# Patient Record
Sex: Female | Born: 1990 | Race: White | Hispanic: No | Marital: Single | Smoking: Current some day smoker
Health system: Southern US, Community
[De-identification: ages and names within clinical notes are randomized; demographics above are authoritative.]

## PROBLEM LIST (undated history)

## (undated) DIAGNOSIS — R569 Unspecified convulsions: Secondary | ICD-10-CM

## (undated) DIAGNOSIS — J45909 Unspecified asthma, uncomplicated: Secondary | ICD-10-CM

## (undated) DIAGNOSIS — F988 Other specified behavioral and emotional disorders with onset usually occurring in childhood and adolescence: Secondary | ICD-10-CM

## (undated) DIAGNOSIS — B009 Herpesviral infection, unspecified: Secondary | ICD-10-CM

## (undated) HISTORY — DX: Herpesviral infection, unspecified: B00.9

## (undated) HISTORY — DX: Unspecified asthma, uncomplicated: J45.909

## (undated) HISTORY — DX: Other specified behavioral and emotional disorders with onset usually occurring in childhood and adolescence: F98.8

---
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
## 2011-01-16 ENCOUNTER — Other Ambulatory Visit (HOSPITAL_COMMUNITY)
Admission: RE | Admit: 2011-01-16 | Discharge: 2011-01-16 | Disposition: A | Discharge status: Home / Self Care | Payer: PRIVATE HEALTH INSURANCE | Source: Ambulatory Visit | Attending: Internal Medicine | Admitting: Internal Medicine

## 2011-01-16 ENCOUNTER — Encounter: Payer: Self-pay | Admitting: Internal Medicine

## 2011-01-16 ENCOUNTER — Ambulatory Visit (INDEPENDENT_AMBULATORY_CARE_PROVIDER_SITE_OTHER): Payer: PRIVATE HEALTH INSURANCE | Admitting: Internal Medicine

## 2011-01-16 VITALS — BP 116/82 | HR 88 | Temp 98.0°F | Ht 67.0 in | Wt 165.0 lb

## 2011-01-16 DIAGNOSIS — Z01419 Encounter for gynecological examination (general) (routine) without abnormal findings: Secondary | ICD-10-CM | POA: Insufficient documentation

## 2011-01-16 DIAGNOSIS — Z0000 Encounter for general adult medical examination without abnormal findings: Secondary | ICD-10-CM

## 2011-01-16 DIAGNOSIS — F988 Other specified behavioral and emotional disorders with onset usually occurring in childhood and adolescence: Secondary | ICD-10-CM

## 2011-01-16 DIAGNOSIS — Z124 Encounter for screening for malignant neoplasm of cervix: Secondary | ICD-10-CM

## 2011-01-16 LAB — POCT URINALYSIS DIPSTICK
Bilirubin, UA: NEGATIVE
Blood, UA: NEGATIVE
Ketones, UA: NEGATIVE
Spec Grav, UA: 1
pH, UA: 5

## 2011-01-16 NOTE — Progress Notes (Signed)
  Subjective:    Patient ID: Kara Riddle, female    DOB: 10/20/1990, 20 y.o.   MRN: [id number]  HPI 20 year old white female with history of attention deficit disorder, rising sophomore at [employer] hoping to study nursing, in today for health maintenance exam. Patient says she made good grades this past year she feels due to Adderall helping her concentrate. She is currently on Adderall XR 30 mg daily. No complaints with menstrual periods. Has had the Gardasil vaccine (3). Other immunizations are up-to-date. No other complaints or problems. 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  Patient does not smoke. Social alcohol consumption. Parents in good health. Menarche was at age 57. Denies being sexually active.    Review of Systems  Constitutional: Negative.   HENT: Negative.   Eyes: Negative.   Respiratory: Negative.   Cardiovascular: Negative.   Gastrointestinal: Negative.   Genitourinary: Negative.   Musculoskeletal: Negative.   Neurological: Negative.   Hematological: Negative.   Psychiatric/Behavioral: Negative.        Objective:   Physical Exam  Constitutional: She is oriented to person, place, and time. She appears well-developed and well-nourished. No distress.  HENT:  Head: Normocephalic and atraumatic.  Right Ear: External ear normal.  Left Ear: External ear normal.  Mouth/Throat: No oropharyngeal exudate.  Eyes: Conjunctivae and EOM are normal. Pupils are equal, round, and reactive to light. No scleral icterus.  Neck: Neck supple. No JVD present. No thyromegaly present.  Cardiovascular: Normal rate, regular rhythm, normal heart sounds and intact distal pulses.   No murmur heard. Pulmonary/Chest: Effort normal and breath sounds normal. No respiratory distress. She has no rales.       Breasts normal female. Some fibrocystic changes bilaterally  Abdominal: Soft. Bowel sounds are normal. She exhibits no mass. There is no rebound.  Genitourinary:  Vagina normal and uterus normal.       Pap taken  Lymphadenopathy:    She has no cervical adenopathy.  Neurological: She is alert and oriented to person, place, and time. She has normal reflexes. No cranial nerve deficit.  Skin: Skin is warm and dry.  Psychiatric: She has a normal mood and affect. Her behavior is normal. Judgment and thought content normal.          Assessment & Plan:  Normal health maintenance exam. Cholesterol normal at 150. CBC with differential is normal.  History of attention deficit disorder maintained on Adderall XR 30 mg daily. 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 Have given her 3 prescriptions for Adderall XR 30 mg daily dated 01/16/2011, 02/16/2011, 03/18/2011 for #30 tablets each. Return one year/when necessary

## 2011-01-18 ENCOUNTER — Other Ambulatory Visit: Payer: Self-pay | Admitting: Internal Medicine

## 2011-04-03 ENCOUNTER — Other Ambulatory Visit: Payer: Self-pay

## 2011-04-03 ENCOUNTER — Telehealth: Payer: Self-pay | Admitting: Internal Medicine

## 2011-04-03 MED ORDER — AMPHETAMINE-DEXTROAMPHET ER 30 MG PO CP24: 30.0000 mg | ORAL_CAPSULE | ORAL | Status: DC

## 2011-04-03 NOTE — Telephone Encounter (Signed)
RX Adderall XR 30mg  daily #30 written

## 2011-05-04 ENCOUNTER — Telehealth: Payer: Self-pay | Admitting: Internal Medicine

## 2011-05-04 NOTE — Telephone Encounter (Signed)
Three written Rx for Adderall XR 30mg  #30 tabs each to take one daily. Pt needs flu vaccine when home or go to drug store.

## 2011-05-05 ENCOUNTER — Other Ambulatory Visit: Payer: Self-pay

## 2011-05-05 MED ORDER — AMPHETAMINE-DEXTROAMPHET ER 30 MG PO CP24: 30.0000 mg | ORAL_CAPSULE | ORAL | Status: DC

## 2011-05-31 ENCOUNTER — Telehealth: Payer: Self-pay | Admitting: Internal Medicine

## 2011-05-31 NOTE — Telephone Encounter (Signed)
See if [facility] will see her before she goes back to school.

## 2011-06-01 NOTE — Telephone Encounter (Signed)
Appt scheduled for patient to see nurse practitioner, Arlana Lindau on January 8th at 3:00pm.  Pt is to arrive at 2:45 with photo ID and Ins card.  Pt made aware of appt.

## 2011-07-13 ENCOUNTER — Other Ambulatory Visit: Payer: Self-pay | Admitting: Internal Medicine

## 2011-07-13 NOTE — Telephone Encounter (Signed)
This refill appears to be too early because she was given 3 rx for Adderall on 05/05/2011. Should not be out yet.

## 2011-07-13 NOTE — Telephone Encounter (Signed)
Spoke with mom, Amy and advised too early to fill. Will call back at end of February

## 2011-07-13 NOTE — Telephone Encounter (Signed)
Please call mother to discuss.

## 2011-07-27 ENCOUNTER — Telehealth: Payer: Self-pay | Admitting: Internal Medicine

## 2011-07-27 MED ORDER — AMPHETAMINE-DEXTROAMPHET ER 30 MG PO CP24: 30.0000 mg | ORAL_CAPSULE | ORAL | Status: DC

## 2011-07-27 NOTE — Telephone Encounter (Signed)
Prescription written and mother picked up.

## 2011-08-31 ENCOUNTER — Telehealth: Payer: Self-pay | Admitting: Internal Medicine

## 2011-08-31 ENCOUNTER — Other Ambulatory Visit: Payer: Self-pay

## 2011-08-31 MED ORDER — AMPHETAMINE-DEXTROAMPHET ER 30 MG PO CP24: 30.0000 mg | ORAL_CAPSULE | ORAL | Status: DC

## 2011-08-31 NOTE — Telephone Encounter (Signed)
rx written for Adderall XR 30 mg daily/0 refills.

## 2011-09-22 ENCOUNTER — Telehealth: Payer: Self-pay | Admitting: Internal Medicine

## 2011-09-24 NOTE — Telephone Encounter (Signed)
Was given refill 3/28/ 13. Cannot refill until 10/01/11. Why is mother calling early???

## 2011-09-26 ENCOUNTER — Telehealth: Payer: Self-pay | Admitting: Internal Medicine

## 2011-09-26 NOTE — Telephone Encounter (Signed)
New Rx for Adderall XR 30 mg daily  #30 with no refill dated April 28th. Due for PE on or after August 15th. Cannot continue to refill Adderall if Appointment for annual PE is not booked. Mother needs to be reminded that we can only refill meds q 30 days and this cannot be too early regardless of circumstances.

## 2011-10-31 ENCOUNTER — Other Ambulatory Visit: Payer: Self-pay

## 2011-10-31 MED ORDER — AMPHETAMINE-DEXTROAMPHET ER 30 MG PO CP24: 30.0000 mg | ORAL_CAPSULE | ORAL | Status: DC

## 2011-11-20 ENCOUNTER — Other Ambulatory Visit: Payer: Self-pay

## 2011-11-20 DIAGNOSIS — B009 Herpesviral infection, unspecified: Secondary | ICD-10-CM

## 2011-11-20 MED ORDER — VALACYCLOVIR HCL 500 MG PO TABS: 500.0000 mg | ORAL_TABLET | Freq: Two times a day (BID) | ORAL | Status: DC

## 2011-11-27 ENCOUNTER — Telehealth: Payer: Self-pay | Admitting: Internal Medicine

## 2011-11-27 NOTE — Telephone Encounter (Signed)
Rx for Adderall XR 30mg  # 30 Written for December 01, 2011. Must keep appt in August for PE

## 2011-12-27 ENCOUNTER — Other Ambulatory Visit: Payer: Self-pay

## 2011-12-27 MED ORDER — AMPHETAMINE-DEXTROAMPHET ER 30 MG PO CP24: 30.0000 mg | ORAL_CAPSULE | ORAL | Status: DC

## 2012-01-18 ENCOUNTER — Other Ambulatory Visit: Payer: PRIVATE HEALTH INSURANCE | Admitting: Internal Medicine

## 2012-01-18 DIAGNOSIS — Z0000 Encounter for general adult medical examination without abnormal findings: Secondary | ICD-10-CM

## 2012-01-18 LAB — CBC WITH DIFFERENTIAL/PLATELET
Basophils Absolute: 0 10*3/uL (ref 0.0–0.1)
Eosinophils Relative: 8 % — ABNORMAL HIGH (ref 0–5)
HCT: 39.1 % (ref 36.0–46.0)
Hemoglobin: 13.2 g/dL (ref 12.0–15.0)
Lymphs Abs: 2.5 10*3/uL (ref 0.7–4.0)
MCH: 29.8 pg (ref 26.0–34.0)
MCHC: 33.8 g/dL (ref 30.0–36.0)
MCV: 88.3 fL (ref 78.0–100.0)
Monocytes Relative: 10 % (ref 3–12)
RBC: 4.43 MIL/uL (ref 3.87–5.11)

## 2012-01-18 LAB — CHOLESTEROL, TOTAL: Cholesterol: 148 mg/dL (ref 0–200)

## 2012-01-19 ENCOUNTER — Encounter: Payer: Self-pay | Admitting: Internal Medicine

## 2012-01-19 ENCOUNTER — Ambulatory Visit (INDEPENDENT_AMBULATORY_CARE_PROVIDER_SITE_OTHER): Payer: PRIVATE HEALTH INSURANCE | Admitting: Internal Medicine

## 2012-01-19 VITALS — BP 130/80 | HR 92 | Temp 98.0°F | Ht 66.25 in | Wt 164.5 lb

## 2012-01-19 DIAGNOSIS — Z0000 Encounter for general adult medical examination without abnormal findings: Secondary | ICD-10-CM

## 2012-01-19 DIAGNOSIS — F988 Other specified behavioral and emotional disorders with onset usually occurring in childhood and adolescence: Secondary | ICD-10-CM

## 2012-01-19 LAB — POCT URINALYSIS DIPSTICK
Spec Grav, UA: >=1.03
Urobilinogen, UA: NEGATIVE

## 2012-01-20 ENCOUNTER — Encounter: Payer: Self-pay | Admitting: Internal Medicine

## 2012-01-20 NOTE — Progress Notes (Signed)
  Subjective:    Patient ID: Kara Riddle, female    DOB: Jan 25, 1991, 21 y.o.   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 History of Attention Deficit Disorder maintained on generic Adderall XR 30 mg daily during school term. Going back to college today. Plan is to major in nursing. Has had Gardasil vaccine. Have sent request for her immunization records from [facility].  Patient does not smoke. Social alcohol consumption. Menarche at age 89. Has had GYN exam in February. Recently placed on Junel oral contraceptives. Has had some issues with breakthrough bleeding on other OCPs according to patient today.  Patient was born by C-section delivery and weighed 8.1 ounces at birth. No complications with delivery or pregnancy. Had normal childhood developmental milestones.  Parents healthy. No full siblings.  Never been hospitalized. No fractures. No operations.      Review of Systems  Constitutional: Negative.   All other systems reviewed and are negative.       Objective:   Physical Exam  Vitals reviewed. Constitutional: She is oriented to person, place, and time. She appears well-developed and well-nourished. No distress.  HENT:  Head: Normocephalic and atraumatic.  Right Ear: External ear normal.  Left Ear: External ear normal.  Mouth/Throat: Oropharynx is clear and moist. No oropharyngeal exudate.  Eyes: Conjunctivae and EOM are normal. Pupils are equal, round, and reactive to light. Right eye exhibits no discharge. Left eye exhibits no discharge. No scleral icterus.  Neck: Neck supple. No thyromegaly present.  Cardiovascular: Normal rate, regular rhythm, normal heart sounds and intact distal pulses.   No murmur heard. Pulmonary/Chest: Effort normal and breath sounds normal. No respiratory distress. She has no wheezes. She has no rales. She exhibits no tenderness.       Breasts normal female  without masses  Abdominal: Soft. Bowel sounds are normal. She exhibits no distension and no mass. There is no tenderness. There is no rebound and no guarding.  Genitourinary:       Deferred to GYN  Musculoskeletal: Normal range of motion. She exhibits no tenderness.  Lymphadenopathy:    She has no cervical adenopathy.  Neurological: She is alert and oriented to person, place, and time. She has normal reflexes. No cranial nerve deficit. Coordination normal.  Skin: Skin is warm and dry. No rash noted. She is not diaphoretic.  Psychiatric: She has a normal mood and affect. Her behavior is normal. Judgment and thought content normal.          Assessment & Plan:  Attention deficit disorder  Normal health maintenance exam  Plan: 3 written prescriptions for Adderall XR 30 mg generic dated August 16, September 16, October 16 for #30 tablets each. Return one year or as needed. See lab results which are within normal limits.

## 2012-01-20 NOTE — Patient Instructions (Addendum)
Continue Adderall XR 30 mg daily while in school and return in one year or as needed

## 2012-04-10 DIAGNOSIS — F988 Other specified behavioral and emotional disorders with onset usually occurring in childhood and adolescence: Secondary | ICD-10-CM

## 2012-04-11 ENCOUNTER — Telehealth: Payer: Self-pay | Admitting: Internal Medicine

## 2012-04-11 MED ORDER — AMPHETAMINE-DEXTROAMPHET ER 30 MG PO CP24: 30.0000 mg | ORAL_CAPSULE | ORAL | Status: DC

## 2012-04-11 NOTE — Telephone Encounter (Signed)
3 separate Rx written for Adderall and signed per Dr. Lenord Fellers

## 2012-07-03 ENCOUNTER — Telehealth: Payer: Self-pay | Admitting: Internal Medicine

## 2012-07-03 DIAGNOSIS — F988 Other specified behavioral and emotional disorders with onset usually occurring in childhood and adolescence: Secondary | ICD-10-CM

## 2012-07-03 MED ORDER — AMPHETAMINE-DEXTROAMPHET ER 30 MG PO CP24: 30.0000 mg | ORAL_CAPSULE | ORAL | Status: DC

## 2012-07-03 NOTE — Telephone Encounter (Signed)
rx for Adderall xr 30 mg printed for 3 months/separate rx

## 2012-08-20 ENCOUNTER — Telehealth: Payer: Self-pay | Admitting: Internal Medicine

## 2012-08-20 DIAGNOSIS — J069 Acute upper respiratory infection, unspecified: Secondary | ICD-10-CM

## 2012-08-20 NOTE — Telephone Encounter (Signed)
Call in Tessalon perles (#60) 2 po tid prn cough with no refill to [facility] [phone]

## 2012-08-20 NOTE — Telephone Encounter (Signed)
Spoke w/K[facility] @ [facility] [place]) [phone]; Rx for Tessalon perles 100 mg #60; 2 po tid prn cough w/0 refill.  Mother (Amy) notified of the Rx.  Bonita Quin advised that this is completed as well.

## 2012-09-26 ENCOUNTER — Other Ambulatory Visit: Payer: Self-pay

## 2012-09-26 MED ORDER — AMPHETAMINE-DEXTROAMPHET ER 30 MG PO CP24: 30.0000 mg | ORAL_CAPSULE | ORAL | Status: DC

## 2012-10-29 ENCOUNTER — Other Ambulatory Visit: Payer: Self-pay

## 2012-10-29 MED ORDER — AMPHETAMINE-DEXTROAMPHET ER 30 MG PO CP24: 30.0000 mg | ORAL_CAPSULE | ORAL | Status: DC

## 2012-12-04 ENCOUNTER — Other Ambulatory Visit: Payer: Self-pay

## 2012-12-04 MED ORDER — AMPHETAMINE-DEXTROAMPHET ER 30 MG PO CP24: 30.0000 mg | ORAL_CAPSULE | ORAL | Status: DC

## 2012-12-20 ENCOUNTER — Other Ambulatory Visit: Payer: Self-pay

## 2012-12-20 ENCOUNTER — Ambulatory Visit: Payer: PRIVATE HEALTH INSURANCE | Admitting: Internal Medicine

## 2012-12-20 ENCOUNTER — Telehealth: Payer: Self-pay | Admitting: Internal Medicine

## 2012-12-20 MED ORDER — NORETHIN ACE-ETH ESTRAD-FE 1.5-30 MG-MCG PO TABS: 1.0000 | ORAL_TABLET | Freq: Every day | ORAL | Status: DC

## 2012-12-20 NOTE — Telephone Encounter (Signed)
Patient scheduled for upcoming CPE.

## 2013-01-02 ENCOUNTER — Other Ambulatory Visit: Payer: Self-pay

## 2013-01-02 MED ORDER — AMPHETAMINE-DEXTROAMPHET ER 30 MG PO CP24: 30.0000 mg | ORAL_CAPSULE | ORAL | Status: DC

## 2013-01-17 ENCOUNTER — Other Ambulatory Visit: Payer: Self-pay | Admitting: Internal Medicine

## 2013-01-20 ENCOUNTER — Encounter: Payer: PRIVATE HEALTH INSURANCE | Admitting: Internal Medicine

## 2013-01-31 ENCOUNTER — Encounter: Payer: Self-pay | Admitting: Internal Medicine

## 2013-01-31 ENCOUNTER — Ambulatory Visit (INDEPENDENT_AMBULATORY_CARE_PROVIDER_SITE_OTHER): Payer: PRIVATE HEALTH INSURANCE | Admitting: Internal Medicine

## 2013-01-31 ENCOUNTER — Other Ambulatory Visit: Payer: Self-pay

## 2013-01-31 VITALS — BP 120/84 | HR 88 | Ht 67.0 in | Wt 172.0 lb

## 2013-01-31 DIAGNOSIS — N921 Excessive and frequent menstruation with irregular cycle: Secondary | ICD-10-CM

## 2013-01-31 DIAGNOSIS — F988 Other specified behavioral and emotional disorders with onset usually occurring in childhood and adolescence: Secondary | ICD-10-CM

## 2013-01-31 DIAGNOSIS — Z0000 Encounter for general adult medical examination without abnormal findings: Secondary | ICD-10-CM

## 2013-01-31 LAB — POCT URINALYSIS DIPSTICK
Bilirubin, UA: NEGATIVE
Glucose, UA: NEGATIVE
Ketones, UA: NEGATIVE
Leukocytes, UA: NEGATIVE
Protein, UA: NEGATIVE

## 2013-01-31 MED ORDER — AMPHETAMINE-DEXTROAMPHET ER 30 MG PO CP24: 30.0000 mg | ORAL_CAPSULE | ORAL | Status: DC

## 2013-02-01 ENCOUNTER — Encounter: Payer: Self-pay | Admitting: Internal Medicine

## 2013-02-01 DIAGNOSIS — N921 Excessive and frequent menstruation with irregular cycle: Secondary | ICD-10-CM | POA: Insufficient documentation

## 2013-02-01 NOTE — Patient Instructions (Addendum)
Take Adderall 5 mg at 3 PM particularly on Tuesdays and Thursdays when he have long days and class. Continue Adderall XR 30 mg daily. Keep appointment with GYN physician we made for you in September regarding breakthrough bleeding. Return in 6 months.

## 2013-02-01 NOTE — Progress Notes (Signed)
Subjective:    Patient ID: Kara Riddle, female    DOB: [birth year], 22 y.o.   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 Patient is on oral contraceptives per [facility] and is still having issues with breakthrough bleeding. She is on low dose estrogen oral contraceptive. History of attention deficit disorder. Currently is on Adderall XR 30 mg daily. This past week was her first week of class and she noticed that her mind was wondering early afternoon. On Tuesdays and Thursdays she has long hours and class. This is probably not good for her to consider such a schedule in the future cause of difficulty concentrating. She wants to try short acting Adderall as an adjuvant to the Adderall XR. We're going to try Adderall 5 mg taken around 3 PM on Tuesdays and Thursdays when she has long days and class. She may be having some tolerance to her Adderall currently. I do not want to continue her on Adderall once she finishes college.  Past medical history: Never been hospitalized. No fractures. No operations. No serious illnesses. Menarche at age 89.  Social history: Single. Does not smoke. Social alcohol consumption.  Has had Gardasil vaccine. Declines influenza immunization today. Tetanus immunization is up-to-date.  Family history: Parents healthy. No full siblings.  Reportedly last Pap was February 2013 at [facility] office. We do not have a copy of the Pap smear today.  Reportedly is on Junel oral contraceptives.    Review of Systems  Constitutional: Negative.   HENT: Negative.   Eyes: Negative.   Respiratory: Negative.   Cardiovascular: Negative.   Gastrointestinal: Negative.   Endocrine: Negative.   Genitourinary:       Breakthrough bleeding on oral contraceptives  Allergic/Immunologic: Negative.   Neurological: Negative.   Hematological: Negative.    Psychiatric/Behavioral: Negative.        Objective:   Physical Exam  Vitals reviewed. Constitutional: She is oriented to person, place, and time. She appears well-developed and well-nourished. No distress.  Overweight.  HENT:  Head: Normocephalic and atraumatic.  Right Ear: External ear normal.  Left Ear: External ear normal.  Mouth/Throat: Oropharynx is clear and moist.  Eyes: Conjunctivae and EOM are normal. Pupils are equal, round, and reactive to light. Left eye exhibits no discharge. No scleral icterus.  Neck: Neck supple. No JVD present. No thyromegaly present.  Cardiovascular: Normal rate, regular rhythm, normal heart sounds and intact distal pulses.   No murmur heard. Pulmonary/Chest: Effort normal and breath sounds normal. No respiratory distress. She has no wheezes. She has no rales. She exhibits no tenderness.  Breasts normal female  Abdominal: Soft. Bowel sounds are normal. She exhibits no distension and no mass. There is no tenderness. There is no rebound and no guarding.  Genitourinary:  Deferred to GYN. They indicate her last Pap smear was in 2013.  Musculoskeletal: Normal range of motion. She exhibits no edema.  Lymphadenopathy:    She has no cervical adenopathy.  Neurological: She is alert and oriented to person, place, and time. She has normal reflexes. She displays normal reflexes. No cranial nerve deficit. Coordination normal.  Skin: Skin is warm and dry. No rash noted. She is not diaphoretic.  Psychiatric: She has a normal mood and affect. Her behavior is normal. Judgment and thought content normal.          Assessment & Plan:  Obesity  Attention deficit disorder without hyperactivity  Breakthrough bleeding on oral contraceptives  Plan: Appointment with GYN physician in September. Try Adderall 5 mg tablets at 3 PM particularly on Tuesdays and Thursdays when she has long days and class. Continue Adderall XR 30 mg daily. Was given a three-month  prescriptions for Adderall XR 30 mg a #60 Adderall 5 mg tablets. Return in 6 months for followup of attention deficit disorder since we are changing her medication regimen. She declines influenza immunization.

## 2013-02-13 ENCOUNTER — Other Ambulatory Visit: Payer: Self-pay | Admitting: Internal Medicine

## 2013-02-14 NOTE — Telephone Encounter (Signed)
Patient has appt with GYN in September. We will not refill after this.

## 2013-04-17 ENCOUNTER — Telehealth: Payer: Self-pay | Admitting: Internal Medicine

## 2013-04-17 NOTE — Telephone Encounter (Signed)
Valtrex 500 mg take 1 tablet twice daily.  #10 with 1 refill.  [facility] [place] [place] [facility][phone].  Left message on prescriber line.

## 2013-05-05 ENCOUNTER — Other Ambulatory Visit: Payer: Self-pay | Admitting: *Deleted

## 2013-05-05 MED ORDER — AMPHETAMINE-DEXTROAMPHET ER 30 MG PO CP24: 30.0000 mg | ORAL_CAPSULE | ORAL | Status: DC

## 2013-06-17 ENCOUNTER — Ambulatory Visit: Payer: PRIVATE HEALTH INSURANCE | Admitting: Internal Medicine

## 2013-08-01 ENCOUNTER — Telehealth: Payer: Self-pay | Admitting: Internal Medicine

## 2013-08-01 ENCOUNTER — Other Ambulatory Visit: Payer: Self-pay

## 2013-08-01 MED ORDER — AMPHETAMINE-DEXTROAMPHET ER 30 MG PO CP24: 30.0000 mg | ORAL_CAPSULE | ORAL | Status: DC

## 2013-08-01 NOTE — Telephone Encounter (Signed)
Refill for 90 days. Spoke with Mother. No concerns.

## 2013-09-03 ENCOUNTER — Telehealth: Payer: Self-pay | Admitting: Internal Medicine

## 2013-09-03 NOTE — Telephone Encounter (Signed)
My note indicated we were refilling for 90 days but I am not sure [facility] [place] [facility] in [facility] indicate only 30  Given with no refill. Please check into this Thursday morning. You may need to call pharmacy and or mother.

## 2013-09-04 NOTE — Telephone Encounter (Signed)
Mom returned my call, and does indeed remember getting 3 Rx for Adderall. States she just needs to remember where she put them, and will contact Eurydice/

## 2013-09-04 NOTE — Telephone Encounter (Signed)
Patient given 3 separate Rx for Adderall on 08/01/2013. Trying to reach mom, as she signed for these. Left message at work.

## 2013-10-29 ENCOUNTER — Telehealth: Payer: Self-pay | Admitting: Internal Medicine

## 2013-10-29 NOTE — Telephone Encounter (Signed)
Refill for 90 days, however must book CPE soon after August 29th or no more refills.

## 2013-10-30 ENCOUNTER — Other Ambulatory Visit: Payer: Self-pay

## 2013-10-30 MED ORDER — AMPHETAMINE-DEXTROAMPHET ER 30 MG PO CP24: 30.0000 mg | ORAL_CAPSULE | ORAL | Status: DC

## 2013-11-06 ENCOUNTER — Ambulatory Visit (INDEPENDENT_AMBULATORY_CARE_PROVIDER_SITE_OTHER): Payer: PRIVATE HEALTH INSURANCE | Admitting: Internal Medicine

## 2013-11-06 ENCOUNTER — Encounter: Payer: Self-pay | Admitting: Internal Medicine

## 2013-11-06 VITALS — BP 125/80 | HR 84 | Wt 182.0 lb

## 2013-11-06 DIAGNOSIS — Z2839 Other underimmunization status: Secondary | ICD-10-CM

## 2013-11-06 DIAGNOSIS — Z9189 Other specified personal risk factors, not elsewhere classified: Secondary | ICD-10-CM

## 2013-11-06 DIAGNOSIS — F988 Other specified behavioral and emotional disorders with onset usually occurring in childhood and adolescence: Secondary | ICD-10-CM

## 2013-11-06 NOTE — Patient Instructions (Addendum)
Forms completed for nursing school entrance. Return next week for vaccines and lab tests

## 2013-11-06 NOTE — Progress Notes (Signed)
   Subjective:    Patient ID: Kara Riddle, female    DOB: November 20, 1990, 23 y.o.   MRN: [id number]  HPI  She is on Adderall for attention deficit disorder. Has GYN and will have GYN visit this year. 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 She also needs to have a Hepatitis B surface antibody. She will come in next week to have these drawn and immunizations given.    Review of Systems     Objective:   Physical Exam Not examined       Assessment & Plan:  Immunization forms for nursing school completed  Return next week for above vaccines in testing. Will not have physical exam here this year since she has GYN physician. Consider further formal evaluation for attention deficit such as [employer] [facility].

## 2013-11-07 ENCOUNTER — Ambulatory Visit: Payer: PRIVATE HEALTH INSURANCE | Admitting: Internal Medicine

## 2013-11-11 ENCOUNTER — Other Ambulatory Visit: Payer: PRIVATE HEALTH INSURANCE | Admitting: Internal Medicine

## 2013-11-14 ENCOUNTER — Other Ambulatory Visit: Payer: PRIVATE HEALTH INSURANCE | Admitting: Internal Medicine

## 2013-11-24 ENCOUNTER — Other Ambulatory Visit (INDEPENDENT_AMBULATORY_CARE_PROVIDER_SITE_OTHER): Payer: PRIVATE HEALTH INSURANCE | Admitting: Internal Medicine

## 2013-11-24 DIAGNOSIS — Z0184 Encounter for antibody response examination: Secondary | ICD-10-CM

## 2013-11-24 DIAGNOSIS — Z23 Encounter for immunization: Secondary | ICD-10-CM

## 2013-11-24 MED ORDER — MENINGOCOCCAL A C Y&W-135 OLIG IM SOLR: 0.5000 mL | Freq: Once | INTRAMUSCULAR | Status: DC

## 2013-11-25 LAB — HEPATITIS B SURFACE ANTIBODY, QUANTITATIVE: HEPATITIS B-POST: 0.3 m[IU]/mL

## 2013-11-25 LAB — VARICELLA ZOSTER ANTIBODY, IGG: VARICELLA IGG: 1433 {index} — AB (ref <135.00)

## 2013-11-26 LAB — QUANTIFERON TB GOLD ASSAY (BLOOD)
Interferon Gamma Release Assay: NEGATIVE
Mitogen value: >10 IU/mL
QUANTIFERON NIL VALUE: 0.04 [IU]/mL
Quantiferon Tb Ag Minus Nil Value: 0.05 IU/mL
TB Ag value: 0.09 IU/mL

## 2013-11-27 NOTE — Progress Notes (Signed)
Left message to call.

## 2014-01-13 ENCOUNTER — Ambulatory Visit (INDEPENDENT_AMBULATORY_CARE_PROVIDER_SITE_OTHER): Payer: PRIVATE HEALTH INSURANCE | Admitting: Internal Medicine

## 2014-01-13 ENCOUNTER — Encounter: Payer: Self-pay | Admitting: Internal Medicine

## 2014-01-13 ENCOUNTER — Ambulatory Visit
Admission: RE | Admit: 2014-01-13 | Discharge: 2014-01-13 | Disposition: A | Discharge status: Home / Self Care | Payer: PRIVATE HEALTH INSURANCE | Source: Ambulatory Visit | Attending: Internal Medicine | Admitting: Internal Medicine

## 2014-01-13 VITALS — BP 130/86 | Temp 98.4°F | Wt 168.0 lb

## 2014-01-13 DIAGNOSIS — J209 Acute bronchitis, unspecified: Secondary | ICD-10-CM

## 2014-01-13 DIAGNOSIS — R062 Wheezing: Secondary | ICD-10-CM

## 2014-01-13 DIAGNOSIS — J45901 Unspecified asthma with (acute) exacerbation: Secondary | ICD-10-CM

## 2014-01-13 DIAGNOSIS — J4551 Severe persistent asthma with (acute) exacerbation: Secondary | ICD-10-CM

## 2014-01-13 MED ORDER — METHYLPREDNISOLONE 4 MG PO TABS: ORAL_TABLET | ORAL | Status: DC

## 2014-01-13 MED ORDER — FLUTICASONE-SALMETEROL 250-50 MCG/DOSE IN AEPB: 1.0000 | INHALATION_SPRAY | Freq: Two times a day (BID) | RESPIRATORY_TRACT | Status: DC

## 2014-01-13 MED ORDER — CLARITHROMYCIN 500 MG PO TABS: 500.0000 mg | ORAL_TABLET | Freq: Two times a day (BID) | ORAL | Status: DC

## 2014-01-13 MED ORDER — ALBUTEROL SULFATE HFA 108 (90 BASE) MCG/ACT IN AERS: INHALATION_SPRAY | RESPIRATORY_TRACT | Status: DC

## 2014-01-13 MED ORDER — FLUCONAZOLE 150 MG PO TABS: 150.0000 mg | ORAL_TABLET | Freq: Once | ORAL | Status: DC

## 2014-01-13 NOTE — Patient Instructions (Addendum)
Take Medrol in a tapering course over 12 days starting with 24 mg and decreasing to 4 mg over 12 days then discontinue. Advair 250/50 one spray by mouth every 12 hours. Albuterol inhaler 2 sprays by mouth 4 times a day for 2-4 weeks.  Biaxin 500 mg twice daily for 10 days. Diflucan should yeast infection develop on antibiotics.Your chest x-ray is negative.We will make appointment for you to see pulmonologist.  Return in 4 weeks for hepatitis B (repeat) vaccine as recent titer is insignificant.  You have been scheduled to see Dr. Melba Coon Young on 02/24/2014 at 3:15pm.

## 2014-01-13 NOTE — Progress Notes (Signed)
   Subjective:    Patient ID: Kara Riddle, female    DOB: 07/10/1990, 23 y.o.   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 Some 3 or 4 weeks ago she had onset of some intermittent coughing with slight discolored sputum which she thought was a respiratory infection.apparently was [employer] [employer] [facility] [employer] and was given a home nebulizer  and in office nebulizer treatment.She's been trying not to use  home nebulizer very often.   She's had some bad nights recently.continues to have some discolored sputum production. No fever or chills. Shortness of breath. Wakes up gasping for breath.Says she was diagnosed with childhood asthma and took albuterol tablets as a child. She has a dog. Has never been allergy tested.  Apparently father [name](Jay Corne) was diagnosed with asthma at age 22.    Review of Systems     Objective:   Physical Exam  Skin warm and dry. Pulse oximetry 92% on room air.TMs and pharynx are clear. Neck is supple. She has small anterior cervical nodes bilaterally that are nontender. Chest: bilateral inspiratory wheezing without rales. Cardiac exam: Tachycardia.      Assessment & Plan:  Acute bronchospasm  Acute bronchitis  Chest x-ray shows no infiltrate. Pulse oximetry did not significantly improve after nebulizer treatment here in the office. Pulse ox remained at 92%.  Plan: Patient was given a 12 day tapering course of Medrol 4 mg going from 24 mg to 0 mg over 12 days. Advair 250/50 one spray by mouth every 12 hours. Albuterol inhaler 2 sprays by mouth 4 times a day. Diflucan 150 mg tablet should she develop Candida vaginitis while on antibiotics and prednisone. Biaxin 500 mg twice daily for 10 days.  Appointment with pulmonologist for evaluation at father's request  Addendum: She will need additional hepatitis B immunization in 4 weeks as she had an insignificant titer recently and will be attending nursing school.

## 2014-01-14 ENCOUNTER — Encounter: Payer: Self-pay | Admitting: Internal Medicine

## 2014-01-14 DIAGNOSIS — J45901 Unspecified asthma with (acute) exacerbation: Secondary | ICD-10-CM | POA: Insufficient documentation

## 2014-01-30 ENCOUNTER — Telehealth: Payer: Self-pay | Admitting: Internal Medicine

## 2014-01-31 NOTE — Telephone Encounter (Signed)
Please give her 90 day supply.

## 2014-02-02 ENCOUNTER — Other Ambulatory Visit: Payer: Self-pay

## 2014-02-02 MED ORDER — AMPHETAMINE-DEXTROAMPHET ER 30 MG PO CP24: 30.0000 mg | ORAL_CAPSULE | ORAL | Status: DC

## 2014-02-19 ENCOUNTER — Ambulatory Visit (INDEPENDENT_AMBULATORY_CARE_PROVIDER_SITE_OTHER): Payer: PRIVATE HEALTH INSURANCE | Admitting: Internal Medicine

## 2014-02-19 DIAGNOSIS — Z23 Encounter for immunization: Secondary | ICD-10-CM

## 2014-02-24 ENCOUNTER — Other Ambulatory Visit (INDEPENDENT_AMBULATORY_CARE_PROVIDER_SITE_OTHER): Payer: PRIVATE HEALTH INSURANCE

## 2014-02-24 ENCOUNTER — Encounter (INDEPENDENT_AMBULATORY_CARE_PROVIDER_SITE_OTHER): Payer: Self-pay

## 2014-02-24 ENCOUNTER — Encounter: Payer: Self-pay | Admitting: Internal Medicine

## 2014-02-24 ENCOUNTER — Ambulatory Visit (INDEPENDENT_AMBULATORY_CARE_PROVIDER_SITE_OTHER): Payer: PRIVATE HEALTH INSURANCE | Admitting: Internal Medicine

## 2014-02-24 VITALS — BP 126/80 | HR 94 | Ht 67.0 in | Wt 178.2 lb

## 2014-02-24 DIAGNOSIS — J45901 Unspecified asthma with (acute) exacerbation: Secondary | ICD-10-CM

## 2014-02-24 DIAGNOSIS — J4551 Severe persistent asthma with (acute) exacerbation: Secondary | ICD-10-CM

## 2014-02-24 DIAGNOSIS — J309 Allergic rhinitis, unspecified: Secondary | ICD-10-CM

## 2014-02-24 DIAGNOSIS — J302 Other seasonal allergic rhinitis: Secondary | ICD-10-CM

## 2014-02-24 DIAGNOSIS — Z23 Encounter for immunization: Secondary | ICD-10-CM

## 2014-02-24 DIAGNOSIS — R062 Wheezing: Secondary | ICD-10-CM

## 2014-02-24 DIAGNOSIS — J3089 Other allergic rhinitis: Secondary | ICD-10-CM

## 2014-02-24 LAB — CBC WITH DIFFERENTIAL/PLATELET
BASOS ABS: 0.1 10*3/uL (ref 0.0–0.1)
Basophils Relative: 0.8 % (ref 0.0–3.0)
Eosinophils Absolute: 0.2 10*3/uL (ref 0.0–0.7)
Eosinophils Relative: 2.5 % (ref 0.0–5.0)
HCT: 44.3 % (ref 36.0–46.0)
Hemoglobin: 14.8 g/dL (ref 12.0–15.0)
LYMPHS PCT: 14.6 % (ref 12.0–46.0)
Lymphs Abs: 1.3 10*3/uL (ref 0.7–4.0)
MCHC: 33.3 g/dL (ref 30.0–36.0)
MCV: 84.3 fl (ref 78.0–100.0)
MONOS PCT: 3 % (ref 3.0–12.0)
Monocytes Absolute: 0.3 10*3/uL (ref 0.1–1.0)
NEUTROS PCT: 79.1 % — AB (ref 43.0–77.0)
Neutro Abs: 7.3 10*3/uL (ref 1.4–7.7)
Platelets: 274 10*3/uL (ref 150.0–400.0)
RBC: 5.25 Mil/uL — AB (ref 3.87–5.11)
RDW: 14.7 % (ref 11.5–15.5)
WBC: 9.2 10*3/uL (ref 4.0–10.5)

## 2014-02-24 MED ORDER — ALBUTEROL SULFATE (2.5 MG/3ML) 0.083% IN NEBU: 2.5000 mg | INHALATION_SOLUTION | Freq: Four times a day (QID) | RESPIRATORY_TRACT | Status: DC | PRN

## 2014-02-24 MED ORDER — ALBUTEROL SULFATE HFA 108 (90 BASE) MCG/ACT IN AERS: INHALATION_SPRAY | RESPIRATORY_TRACT | Status: DC

## 2014-02-24 MED ORDER — COMPRESSOR/NEBULIZER MISC: Status: DC

## 2014-02-24 MED ORDER — MONTELUKAST SODIUM 10 MG PO TABS: 10.0000 mg | ORAL_TABLET | Freq: Every day | ORAL | Status: DC

## 2014-02-24 NOTE — Progress Notes (Signed)
02/24/14- 23 yoF never smoker referred courtesy ofDr Docia Chuck; had alot of wheezing as a child. This summer it has flared up more so. 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 She had had some light wheezing as a child without medications or emergency room visits. She has been gradually wheezing more in the last few years and flared especially in late July of this year. Now on a prednisone taper for the last week and a half, saying she flares within 3 days off of prednisone. Current dose is 10 mg daily. This July exacerbation might have begun with a cold. She describes throat itching but denies any history of recognized atopy and denies any sensitivity to aspirin or latex. She denies pregnancy. She has not noticed any particular relationship to weather, location or exposure. We discussed casual exposure to friends who have been smoking. Otherwise health has been very good. She is not sure if she has refluxed. CXR 01/13/14 IMPRESSION:  No active cardiopulmonary disease.  Electronically Signed  By: Dwyane Dee M.D.  On: 01/13/2014 15:54  Prior to Admission medications   Medication Sig Start Date End Date Taking? Authorizing Provider  albuterol (PROVENTIL HFA;VENTOLIN HFA) 108 (90 BASE) MCG/ACT inhaler Use 2 sprays po 4 times daily x 2-4 weeks. 02/24/14  Yes Waymon Budge, MD  amphetamine-dextroamphetamine (ADDERALL XR) 30 MG 24 hr capsule Take 1 capsule (30 mg total) by mouth every morning. Fill after10/30/2015 02/02/14  Yes Margaree Mackintosh, MD  Fluticasone-Salmeterol (ADVAIR DISKUS) 250-50 MCG/DOSE AEPB Inhale 1 puff into the lungs 2 (two) times daily. 01/13/14  Yes Margaree Mackintosh, MD  JUNEL FE 1.5/30 1.5-30 MG-MCG tablet TAKE 1 TABLET BY MOUTH DAILY. 02/13/13  Yes Margaree Mackintosh, MD  predniSONE (DELTASONE) 10 MG tablet Take 10 mg by mouth daily with breakfast.    Yes Historical Provider, MD  valACYclovir (VALTREX) 500 MG tablet Take 1 tablet (500 mg total) by mouth 2 (two) times daily. 11/20/11  Yes Margaree Mackintosh, MD  albuterol (PROVENTIL) (2.5 MG/3ML) 0.083% nebulizer solution Take 3 mLs (2.5 mg total) by nebulization every 6 (six) hours as needed for wheezing or shortness of breath. 02/24/14   Waymon Budge, MD  montelukast (SINGULAIR) 10 MG tablet Take 1 tablet (10 mg total) by mouth at bedtime. 02/24/14   Waymon Budge, MD  Nebulizers (COMPRESSOR/NEBULIZER) MISC Use as directed 02/24/14   Waymon Budge, MD   Past Medical History  Diagnosis Date  . HSV-1 (herpes simplex virus 1) infection   . ADD (attention deficit disorder)   . Asthma    History reviewed. No pertinent past surgical history. Family History  Problem Relation Age of Onset  . Asthma Father   . Allergies Father   . Pancreatic cancer Maternal Grandmother    History   Social History  . Marital Status: Single    Spouse Name: N/A    Number of Children: 0  . Years of Education: N/A   Occupational History  . student at [employer]    Social History Main Topics  . Smoking status: Never Smoker   . Smokeless tobacco: Not on file     Comment: passive exposure-has tired in the past  . Alcohol Use: Yes     Comment: beer once a week on avg  . Drug Use: No  . Sexual Activity: Not on file  Other Topics Concern  . Not on file   Social History Narrative  . No narrative on file   ROS-see HPI Constitutional:   No-   weight loss, night sweats, fevers, chills, fatigue, lassitude. HEENT:   +  headaches, difficulty swallowing, tooth/dental problems, sore throat,       No-  sneezing, itching, ear ache, nasal congestion, post nasal drip,  CV:  +chest pain, orthopnea, PND, +swelling in lower extremities, anasarca,                                  dizziness, palpitations Resp:+  shortness of breath with exertion or at rest.              + productive cough,  No non-productive cough,  No-  coughing up of blood.              No-   change in color of mucus.  No- wheezing.   Skin: No-   rash or lesions. GI:  No-   heartburn, indigestion, abdominal pain, nausea, vomiting, diarrhea,                 change in bowel habits, loss of appetite GU: No-   dysuria, change in color of urine, no urgency or frequency.  No- flank pain. MS:  No-   joint pain or swelling.  No- decreased range of motion.  No- back pain. Neuro-     nothing unusual Psych:  No- change in mood or affect. No depression or anxiety.  No memory loss.  OBJ- Physical Exam General- Alert, Oriented, Affect-appropriate, Distress- none acute Skin- rash-none, lesions- none, excoriation- none. +facial scars from old trauma. Lymphadenopathy- none Head- atraumatic            Eyes- Gross vision intact, PERRLA, conjunctivae and secretions clear            Ears- Hearing, canals-normal            Nose- Clear, no-Septal dev, mucus, polyps, erosion, perforation. +rubbing nose and                                    sniffing            Throat- Mallampati II , mucosa clear , drainage- none, tonsils- atrophic Neck- flexible , trachea midline, no stridor , thyroid nl, carotid no bruit Chest - symmetrical excursion , unlabored           Heart/CV- RRR , no murmur , no gallop  , no rub, nl s1 s2                           - JVD- none , edema- none, stasis changes- none, varices- none           Lung- clear to P&A, wheeze+I&E, cough- none , dullness-none, rub- none           Chest wall-  Abd- tender-no, distended-no, bowel sounds-present, HSM- no Br/ Gen/ Rectal- Not done, not indicated Extrem- cyanosis- none, clubbing, none, atrophy- none, strength- nl Neuro- grossly intact to observation

## 2014-02-24 NOTE — Assessment & Plan Note (Signed)
She is actually rubbing her nose and sniffing well complaining about itching in her throat. There is no stridor or angioedema. Need to rule out an allergic component. Plan-allergy profile, add Singulair

## 2014-02-24 NOTE — Patient Instructions (Addendum)
Sample x 2 Advair 500   1 puff then rinse mouth well, twice daily  Ok to use your albuterol rescue inhaler 2 puffs every 4 hours if needed  Finish the prednisone and watch  Script to start Singulair/ montelukast -  Script for nebulizer machine and for Duoneb (ipratropium plus albuterol) to use with it, up to every 6 hours when needed  Flu vax  Order- lab- CBC w diff, Allergy Profile    Dx Asthma moderate with exacerbation

## 2014-02-24 NOTE — Assessment & Plan Note (Addendum)
She has not identified a specific trigger but she describes itching in her throat and she is sniffing and rubbing her nose in a way that suggests an allergic component which would be, at her age. Control of her asthma now is not adequate Plan-increase inhaled cortisone by giving samples of Advair 500 for 2 weeks, then returned to Advair 250. Add Singulair. Discussed appropriate use of medications. Lab for allergy profile. Flu vaccine given

## 2014-02-25 LAB — ALLERGY FULL PROFILE
Allergen, D pternoyssinus,d7: <0.1 kU/L
Allergen,Goose feathers, e70: <0.1 kU/L
Alternaria Alternata: <0.1 kU/L
Aspergillus fumigatus, m3: <0.1 kU/L
Bahia Grass: <0.1 kU/L
Bermuda Grass: <0.1 kU/L
Box Elder IgE: <0.1 kU/L
Candida Albicans: <0.1 kU/L
Cat Dander: <0.1 kU/L
Common Ragweed: <0.1 kU/L
Curvularia lunata: <0.1 kU/L
D. farinae: <0.1 kU/L
Dog Dander: <0.1 kU/L
Elm IgE: <0.1 kU/L
Fescue: <0.1 kU/L
G005 Rye, Perennial: <0.1 kU/L
G009 Red Top: <0.1 kU/L
Goldenrod: <0.1 kU/L
Helminthosporium halodes: <0.1 kU/L
House Dust Hollister: <0.1 kU/L
IgE (Immunoglobulin E), Serum: 185 kU/L — ABNORMAL HIGH (ref <115)
Lamb's Quarters: <0.1 kU/L
Oak: <0.1 kU/L
Plantain: <0.1 kU/L
Stemphylium Botryosum: <0.1 kU/L
Sycamore Tree: <0.1 kU/L
Timothy Grass: <0.1 kU/L

## 2014-03-26 ENCOUNTER — Ambulatory Visit (INDEPENDENT_AMBULATORY_CARE_PROVIDER_SITE_OTHER): Payer: PRIVATE HEALTH INSURANCE | Admitting: Internal Medicine

## 2014-03-26 DIAGNOSIS — Z23 Encounter for immunization: Secondary | ICD-10-CM | POA: Diagnosis not present

## 2014-03-26 MED ORDER — HEPATITIS B VAC RECOMBINANT 5 MCG/0.5ML IJ SUSP: 0.5000 mL | Freq: Once | INTRAMUSCULAR | Status: DC

## 2014-04-17 ENCOUNTER — Telehealth: Payer: Self-pay | Admitting: Internal Medicine

## 2014-04-17 NOTE — Telephone Encounter (Signed)
Called and spoke to pt's mother. Pt's mother is requesting new script for a neb machine. Pt was given a paper script of this at last OV but lost the script. Advised pt's mother that the message will be viewed bu CY at the earliest on Monday. Pt has appt on 11/19 and mother stated she will just wait till appt. Nothing further needed.

## 2014-04-23 ENCOUNTER — Ambulatory Visit: Payer: PRIVATE HEALTH INSURANCE | Admitting: Internal Medicine

## 2014-04-29 ENCOUNTER — Telehealth: Payer: Self-pay | Admitting: Internal Medicine

## 2014-04-29 NOTE — Telephone Encounter (Signed)
Florentina AddisonKatie- sorry about this. Please see what you can do for work in.

## 2014-04-29 NOTE — Telephone Encounter (Signed)
Spoke with pt's father, states that on 11/19 pt signed in for appt, went to restroom after letting receptionist know, and came back to wait in the lobby for over an hour.  When pt went up front to ask if CY was that far behind, she was told that her name was called and she missed her appt.  Pt was flustered and left without rescheduling.  Pt and father are very upset about this.  Pt is on prednisone and per father is not tolerating this well.  She is also experiencing some wheezing and prod cough with unknown color.  Father states that pt cannot wait until CY's next available appt in January to see him.  CY please advise.  Thank you.  No Known Allergies Current Outpatient Prescriptions on File Prior to Visit  Medication Sig Dispense Refill  . albuterol (PROVENTIL HFA;VENTOLIN HFA) 108 (90 BASE) MCG/ACT inhaler Use 2 sprays po 4 times daily x 2-4 weeks. 1 Inhaler prn  . albuterol (PROVENTIL) (2.5 MG/3ML) 0.083% nebulizer solution Take 3 mLs (2.5 mg total) by nebulization every 6 (six) hours as needed for wheezing or shortness of breath. 75 mL 12  . amphetamine-dextroamphetamine (ADDERALL XR) 30 MG 24 hr capsule Take 1 capsule (30 mg total) by mouth every morning. Fill after10/30/2015 30 capsule 0  . Fluticasone-Salmeterol (ADVAIR DISKUS) 250-50 MCG/DOSE AEPB Inhale 1 puff into the lungs 2 (two) times daily. 1 each 3  . JUNEL FE 1.5/30 1.5-30 MG-MCG tablet TAKE 1 TABLET BY MOUTH DAILY. 28 tablet 1  . montelukast (SINGULAIR) 10 MG tablet Take 1 tablet (10 mg total) by mouth at bedtime. 30 tablet prn  . Nebulizers (COMPRESSOR/NEBULIZER) MISC Use as directed 1 each prn  . predniSONE (DELTASONE) 10 MG tablet Take 10 mg by mouth daily with breakfast.    . valACYclovir (VALTREX) 500 MG tablet Take 1 tablet (500 mg total) by mouth 2 (two) times daily. 10 tablet 1   Current Facility-Administered Medications on File Prior to Visit  Medication Dose Route Frequency Provider Last Rate Last Dose  . hepatitis B  vac recombinant for pediatrics (RECOMBIVAX) injection 5 mcg  0.5 mL Intramuscular Once Margaree MackintoshMary J Baxley, MD      . meningococcal oligosaccharide (MENVEO) injection 0.5 mL  0.5 mL Intramuscular Once Margaree MackintoshMary J Baxley, MD

## 2014-04-29 NOTE — Telephone Encounter (Signed)
Pt can come in to see CY on Monday 05-04-14 at 11:15am. Thanks.

## 2014-04-29 NOTE — Telephone Encounter (Signed)
Spoke with pt's father, appt scheduled for 11/30 at 11:15.  Nothing further needed.

## 2014-05-04 ENCOUNTER — Encounter: Payer: Self-pay | Admitting: Internal Medicine

## 2014-05-04 ENCOUNTER — Ambulatory Visit: Payer: PRIVATE HEALTH INSURANCE | Admitting: Internal Medicine

## 2014-05-04 VITALS — BP 158/84 | HR 95 | Ht 68.0 in | Wt 188.4 lb

## 2014-05-04 DIAGNOSIS — J4541 Moderate persistent asthma with (acute) exacerbation: Secondary | ICD-10-CM

## 2014-05-04 MED ORDER — COMPRESSOR/NEBULIZER MISC: Status: DC

## 2014-05-04 NOTE — Progress Notes (Signed)
02/24/14- 23 yoF never smoker referred courtesy ofDr Kara ChuckMary Riddle-asthma; had alot of wheezing as a child. This summer it has flared up more so. 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 She had had some light wheezing as a child without medications or emergency room visits. She has been gradually wheezing more in the last few years and flared especially in late July of this year. Now on a prednisone taper for the last week and a half, saying she flares within 3 days off of prednisone. Current dose is 10 mg daily. This July exacerbation might have begun with a cold. She describes throat itching but denies any history of recognized atopy and denies any sensitivity to aspirin or latex. She denies pregnancy. She has not noticed any particular relationship to weather, location or exposure. We discussed casual exposure to friends who have been smoking. Otherwise health has been very good. She is not sure if she has refluxed. CXR 01/13/14 IMPRESSION:  No active cardiopulmonary disease.  Electronically Signed  By: Dwyane DeePaul Barry M.D.  On: 01/13/2014 15:54  05/04/14- 23 yoF never smoker referred courtesy ofDr Kara ChuckMary Riddle-asthma; had alot of wheezing as a child FOLLOWS FOR: past 2 days have been hard-cold like symptoms, wheezing as well.  Allergy profile 02/24/14- Total IgE 185, but no specific elevations    ROS-see HPI Constitutional:   No-   weight loss, night sweats, fevers, chills, fatigue, lassitude. HEENT:   +  headaches, difficulty swallowing, tooth/dental problems, sore throat,       No-  sneezing, itching, ear ache, nasal congestion, post nasal drip,  CV:  +chest pain, orthopnea, PND, +swelling in lower extremities, anasarca,                                  dizziness, palpitations Resp:+  shortness of breath with exertion or at rest.              + productive  cough,  No non-productive cough,  No- coughing up of blood.              No-   change in color of mucus.  No- wheezing.   Skin: No-   rash or lesions. GI:  No-   heartburn, indigestion, abdominal pain, nausea, vomiting, diarrhea,                 change in bowel habits, loss of appetite GU: No-   dysuria, change in color of urine, no urgency or frequency.  No- flank pain. MS:  No-   joint pain or swelling.  No- decreased range of motion.  No- back pain. Neuro-     nothing unusual Psych:  No- change in mood or affect. No depression or anxiety.  No memory loss.  OBJ- Physical Exam General- Alert, Oriented, Affect-appropriate, Distress- none acute Skin- rash-none, lesions- none, excoriation- none. +facial scars from old trauma. Lymphadenopathy- none Head- atraumatic            Eyes- Gross vision intact, PERRLA, conjunctivae and secretions clear            Ears- Hearing, canals-normal            Nose- Clear, no-Septal dev, mucus, polyps, erosion, perforation. +rubbing nose and  sniffing            Throat- Mallampati II , mucosa clear , drainage- none, tonsils- atrophic Neck- flexible , trachea midline, no stridor , thyroid nl, carotid no bruit Chest - symmetrical excursion , unlabored           Heart/CV- RRR , no murmur , no gallop  , no rub, nl s1 s2                           - JVD- none , edema- none, stasis changes- none, varices- none           Lung- clear to P&A, wheeze+I&E, cough- none , dullness-none, rub- none           Chest wall-  Abd- tender-no, distended-no, bowel sounds-present, HSM- no Br/ Gen/ Rectal- Not done, not indicated Extrem- cyanosis- none, clubbing, none, atrophy- none, strength- nl Neuro- grossly intact to observation

## 2014-05-04 NOTE — Patient Instructions (Addendum)
Script for neb solution sent   Sample Dulera 200   2 puffs then rinse mouth, twice daily. Try also using this before you eat, to reduce chance of thrush   Try this instead of Advair   When you are feeling better, try reducing the prednisone to alternate 10 mg with 5 mg every other day. This will be a step towards weaning off if we can.  Script reprinted for nebulizer machine

## 2014-05-22 ENCOUNTER — Ambulatory Visit: Payer: PRIVATE HEALTH INSURANCE | Admitting: Internal Medicine

## 2014-06-03 ENCOUNTER — Encounter: Payer: Self-pay | Admitting: Internal Medicine

## 2014-06-03 ENCOUNTER — Ambulatory Visit (INDEPENDENT_AMBULATORY_CARE_PROVIDER_SITE_OTHER): Payer: PRIVATE HEALTH INSURANCE | Admitting: Internal Medicine

## 2014-06-03 VITALS — BP 124/80 | HR 105 | Ht 68.0 in | Wt 189.0 lb

## 2014-06-03 DIAGNOSIS — J309 Allergic rhinitis, unspecified: Secondary | ICD-10-CM

## 2014-06-03 DIAGNOSIS — J3089 Other allergic rhinitis: Secondary | ICD-10-CM

## 2014-06-03 DIAGNOSIS — J302 Other seasonal allergic rhinitis: Secondary | ICD-10-CM

## 2014-06-03 DIAGNOSIS — J4540 Moderate persistent asthma, uncomplicated: Secondary | ICD-10-CM | POA: Insufficient documentation

## 2014-06-03 MED ORDER — FLUTICASONE-SALMETEROL 115-21 MCG/ACT IN AERO: INHALATION_SPRAY | RESPIRATORY_TRACT | Status: DC

## 2014-06-03 MED ORDER — PREDNISONE 5 MG PO TABS: ORAL_TABLET | ORAL | Status: DC

## 2014-06-03 NOTE — Progress Notes (Signed)
02/24/14- 23 yoF never smoker referred courtesy ofDr Docia ChuckMary Baxley-asthma; had alot of wheezing as a child. This summer it has flared up more so. 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 She had had some light wheezing as a child without medications or emergency room visits. She has been gradually wheezing more in the last few years and flared especially in late July of this year. Now on a prednisone taper for the last week and a half, saying she flares within 3 days off of prednisone. Current dose is 10 mg daily. This July exacerbation might have begun with a cold. She describes throat itching but denies any history of recognized atopy and denies any sensitivity to aspirin or latex. She denies pregnancy. She has not noticed any particular relationship to weather, location or exposure. We discussed casual exposure to friends who have been smoking. Otherwise health has been very good. She is not sure if she has refluxed. CXR 01/13/14 IMPRESSION:  No active cardiopulmonary disease.  Electronically Signed  By: Dwyane DeePaul Barry M.D.  On: 01/13/2014 15:54  05/04/14- 23 yoF never smoker referred courtesy ofDr Docia ChuckMary Baxley-asthma; had alot of wheezing as a child FOLLOWS FOR: past 2 days have been hard-cold like symptoms, wheezing as well.  Allergy profile 02/24/14- Total IgE 185, but no specific elevations  06/03/14- 23 yoF never smoker followed for asthma, allergic rhinitis FOLLOW FOR:  Wheezing; was using boyfriend's Advair inhaler (not diskus) and ran out of it, started wheezing during the summer. She currently feels well. Has continued maintenance prednisone 10 mg daily and we talked about wanting to minimize that if we can. She has been using Dulera but would like to go back to Advair HFA.  ROS-see HPI Constitutional:   No-   weight loss, night sweats, fevers, chills,  fatigue, lassitude. HEENT:   +  headaches, difficulty swallowing, tooth/dental problems, sore throat,       No-  sneezing, itching, ear ache, nasal congestion, post nasal drip,  CV:  No-chest pain, orthopnea, PND, +swelling in lower extremities, anasarca,                                  dizziness, palpitations Resp:+  shortness of breath with exertion or at rest.              No- productive cough,  No non-productive cough,  No- coughing up of blood.              No-   change in color of mucus.  No- wheezing.   Skin: No-   rash or lesions. GI:  No-   heartburn, indigestion, abdominal pain, nausea, vomiting, diarrhea,                 change in bowel habits, loss of appetite GU: MS:  No-   joint pain or swelling.. Neuro-     nothing unusual Psych:  No- change in mood or affect. No depression or anxiety.  No memory loss.  OBJ- Physical Exam General- Alert, Oriented, Affect-appropriate, Distress- none acute Skin- rash-none, lesions- none, excoriation- none. +facial scars from old trauma. Lymphadenopathy- none Head- atraumatic            Eyes- Gross vision intact, PERRLA, conjunctivae and secretions clear  Ears- Hearing, canals-normal            Nose- Clear, no-Septal dev, mucus, polyps, erosion, perforation.             Throat- Mallampati II , mucosa clear , drainage- none, tonsils- atrophic Neck- flexible , trachea midline, no stridor , thyroid nl, carotid no bruit Chest - symmetrical excursion , unlabored           Heart/CV- RRR , no murmur , no gallop  , no rub, nl s1 s2                           - JVD- none , edema- none, stasis changes- none, varices- none           Lung- clear to P&A, wheeze+trace, cough- none , dullness-none, rub- none           Chest wall-  Abd-  Br/ Gen/ Rectal- Not done, not indicated Extrem- cyanosis- none, clubbing, none, atrophy- none, strength- nl Neuro- grossly intact to observation

## 2014-06-03 NOTE — Assessment & Plan Note (Signed)
Does need a maintenance controller medication. Plan-Advair HFA, try reducing maintenance prednisone to 5 mg daily and then maybe 25 mg every other day if she can.

## 2014-06-03 NOTE — Assessment & Plan Note (Deleted)
Does need a maintenance controller medication. Plan-Advair HFA, try reducing maintenance prednisone to 5 mg daily and then maybe 25 mg every other day if she can. 

## 2014-06-03 NOTE — Patient Instructions (Addendum)
Script sent to reduce prednisone to 5 mg daily until next visit. If you find you are really doing well, then you can try reducing prednisone to 5 mg every other day.  Script sent to change your maintenance controller inhaler to Advair 115 HFA  Please call as needed

## 2014-06-03 NOTE — Assessment & Plan Note (Signed)
She sounds just a little stuffy but is not sniffing this visit and seems to be doing better

## 2014-07-15 ENCOUNTER — Other Ambulatory Visit: Payer: Self-pay | Admitting: Internal Medicine

## 2014-08-03 ENCOUNTER — Encounter (INDEPENDENT_AMBULATORY_CARE_PROVIDER_SITE_OTHER): Payer: Self-pay

## 2014-08-03 ENCOUNTER — Encounter: Payer: Self-pay | Admitting: Internal Medicine

## 2014-08-03 ENCOUNTER — Ambulatory Visit (INDEPENDENT_AMBULATORY_CARE_PROVIDER_SITE_OTHER): Payer: PRIVATE HEALTH INSURANCE | Admitting: Internal Medicine

## 2014-08-03 VITALS — BP 118/60 | HR 97 | Ht 68.0 in | Wt 180.0 lb

## 2014-08-03 DIAGNOSIS — J309 Allergic rhinitis, unspecified: Secondary | ICD-10-CM

## 2014-08-03 DIAGNOSIS — J4541 Moderate persistent asthma with (acute) exacerbation: Secondary | ICD-10-CM

## 2014-08-03 DIAGNOSIS — J3089 Other allergic rhinitis: Secondary | ICD-10-CM

## 2014-08-03 DIAGNOSIS — J302 Other seasonal allergic rhinitis: Secondary | ICD-10-CM

## 2014-08-03 MED ORDER — FLUTICASONE-SALMETEROL 230-21 MCG/ACT IN AERO: 2.0000 | INHALATION_SPRAY | Freq: Two times a day (BID) | RESPIRATORY_TRACT | Status: DC

## 2014-08-03 NOTE — Patient Instructions (Signed)
Script for higher strength Advair HFA inhaler to try.  See how you feel if your reduce the prednisone to 5 mg every other day. You can take an extra on bad days if needed.  For allergic nose, you can use an antihistamine like claritin, allegra. You can also use an otc steroid nasal spray like Flonase or Nasacort.

## 2014-08-03 NOTE — Progress Notes (Signed)
02/24/14- 23 yoF never smoker referred courtesy ofDr Docia Chuck; had alot of wheezing as a child. This summer it has flared up more so. 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 She had had some light wheezing as a child without medications or emergency room visits. She has been gradually wheezing more in the last few years and flared especially in late July of this year. Now on a prednisone taper for the last week and a half, saying she flares within 3 days off of prednisone. Current dose is 10 mg daily. This July exacerbation might have begun with a cold. She describes throat itching but denies any history of recognized atopy and denies any sensitivity to aspirin or latex. She denies pregnancy. She has not noticed any particular relationship to weather, location or exposure. We discussed casual exposure to friends who have been smoking. Otherwise health has been very good. She is not sure if she has refluxed. CXR 01/13/14 IMPRESSION:  No active cardiopulmonary disease.  Electronically Signed  By: Dwyane Dee M.D.  On: 01/13/2014 15:54  05/04/14- 23 yoF never smoker referred courtesy ofDr Docia Chuck; had alot of wheezing as a child FOLLOWS FOR: past 2 days have been hard-cold like symptoms, wheezing as well.  Allergy profile 02/24/14- Total IgE 185, but no specific elevations  06/03/14- 23 yoF never smoker followed for asthma, allergic rhinitis FOLLOW FOR:  Wheezing; was using boyfriend's Advair inhaler (not diskus) and ran out of it, started wheezing during the summer. She currently feels well. Has continued maintenance prednisone 10 mg daily and we talked about wanting to minimize that if we can. She has been using Dulera but would like to go back to Advair HFA.  08/03/14- 23 yoF never smoker followed for asthma, allergic rhinitis FOLLOWS FOR:  has trouble breathing in older homes/buildings and outdoors. Otherwise doing well. Prednisone 5 mg maintenance daily for marginal asthma control. Advair HFA "big help". Worse rainy weather. Did well in [place]. Occ wakes asthma.   ROS-see HPI Constitutional:   No-   weight loss, night sweats, fevers, chills, fatigue, lassitude. HEENT:   +  headaches, difficulty swallowing, tooth/dental problems, sore throat,       No-  sneezing, itching, ear ache, nasal congestion, post nasal drip,  CV:  No-chest pain, orthopnea, PND, +swelling in lower extremities, anasarca,                                  dizziness, palpitations Resp:+  shortness of breath with exertion or at rest.              No- productive cough,  No non-productive cough,  No- coughing up of blood.              No-   change in color of mucus.  +wheezing.   Skin: No-   rash or lesions. GI:  No-   heartburn, indigestion, abdominal pain, nausea, vomiting, diarrhea,                 change in bowel habits, loss of appetite GU: MS:  No-   joint pain or swelling.. Neuro-     nothing unusual Psych:  No- change in mood or affect. No depression or anxiety.  No memory loss.  OBJ- Physical Exam General- Alert, Oriented, Affect-appropriate,  Distress- none acute Skin- rash-none, lesions- none, excoriation- none. +facial scars from old trauma. Lymphadenopathy- none Head- atraumatic            Eyes- Gross vision intact, PERRLA, conjunctivae and secretions clear            Ears- Hearing, canals-normal            Nose- +sniffing/ rubbing nose, no-Septal dev, mucus, polyps, erosion, perforation.             Throat- Mallampati II , mucosa clear , drainage- none, tonsils- atrophic Neck- flexible , trachea midline, no stridor , thyroid nl, carotid no bruit Chest - symmetrical excursion , unlabored           Heart/CV- RRR , no murmur , no gallop  , no rub, nl s1 s2                           - JVD- none , edema- none, stasis changes- none, varices-  none           Lung-  Wheeze+trace I&E, cough- none , dullness-none, rub- none           Chest wall-  Abd-  Br/ Gen/ Rectal- Not done, not indicated Extrem- cyanosis- none, clubbing, none, atrophy- none, strength- nl Neuro- grossly intact to observation

## 2014-08-04 NOTE — Assessment & Plan Note (Signed)
Want better control with less systemic prednisone. Plan- Increase Advair HFA to highest strength, try reducing prednisone to 5 mg every other day.

## 2014-08-04 NOTE — Assessment & Plan Note (Signed)
Discussed relation of rhinitis to chest symptoms Plan- maintenance antihistamine, Flonase

## 2014-08-13 ENCOUNTER — Ambulatory Visit: Payer: PRIVATE HEALTH INSURANCE | Admitting: Internal Medicine

## 2014-08-20 ENCOUNTER — Telehealth: Payer: Self-pay | Admitting: Internal Medicine

## 2014-08-20 ENCOUNTER — Ambulatory Visit: Payer: PRIVATE HEALTH INSURANCE | Admitting: Internal Medicine

## 2014-08-20 NOTE — Telephone Encounter (Signed)
Patient had appointment today for third hepatitis B vaccine and to have hepatitis B surface antibody titer drawn. She did not keep appointment. I have left messages on herself alone and home phone number regarding missed appointment.

## 2014-10-07 ENCOUNTER — Other Ambulatory Visit: Payer: Self-pay | Admitting: Internal Medicine

## 2014-10-08 ENCOUNTER — Other Ambulatory Visit: Payer: Self-pay | Admitting: Internal Medicine

## 2014-11-05 ENCOUNTER — Ambulatory Visit: Payer: PRIVATE HEALTH INSURANCE | Admitting: Internal Medicine

## 2015-03-03 ENCOUNTER — Other Ambulatory Visit: Payer: Self-pay | Admitting: Internal Medicine

## 2015-03-08 ENCOUNTER — Other Ambulatory Visit: Payer: Self-pay | Admitting: Internal Medicine

## 2015-04-20 ENCOUNTER — Other Ambulatory Visit: Payer: Self-pay | Admitting: Internal Medicine

## 2015-05-12 ENCOUNTER — Other Ambulatory Visit: Payer: Self-pay | Admitting: Internal Medicine

## 2015-07-07 ENCOUNTER — Telehealth: Payer: Self-pay | Admitting: Internal Medicine

## 2015-07-07 NOTE — Telephone Encounter (Signed)
Insurance is no longer covering Proventil HFA. Pt states that her insurance does not have Proair either.  Please advise Dr Maple Hudson. Thanks.     Medication List       This list is accurate as of: 07/07/15  5:29 PM.  Always use your most recent med list.               albuterol (2.5 MG/3ML) 0.083% nebulizer solution  Commonly known as:  PROVENTIL  Take 3 mLs (2.5 mg total) by nebulization every 6 (six) hours as needed for wheezing or shortness of breath.     VENTOLIN HFA 108 (90 Base) MCG/ACT inhaler  Generic drug:  albuterol  INHALE 2 PUFFS BY MOUTH 4 TIMES A DAY FOR 2-4 WEEKS     VENTOLIN HFA 108 (90 Base) MCG/ACT inhaler  Generic drug:  albuterol  INHALE 2 PUFFS BY MOUTH 4 TIMES A DAY FOR 2-4 WEEKS     amphetamine-dextroamphetamine 30 MG 24 hr capsule  Commonly known as:  ADDERALL XR  Take 1 capsule (30 mg total) by mouth every morning. Fill after10/30/2015     Compressor/Nebulizer Misc  Use as directed     ADVAIR HFA 115-21 MCG/ACT inhaler  Generic drug:  fluticasone-salmeterol     fluticasone-salmeterol 230-21 MCG/ACT inhaler  Commonly known as:  ADVAIR HFA  Inhale 2 puffs into the lungs 2 (two) times daily. Rinse mouth     JUNEL 1.5/30 1.5-30 MG-MCG tablet  Generic drug:  Norethindrone Acetate-Ethinyl Estradiol  Take 1 tablet by mouth daily.     JUNEL FE 1.5/30 1.5-30 MG-MCG tablet  Generic drug:  norethindrone-ethinyl estradiol-iron  TAKE 1 TABLET BY MOUTH DAILY.     montelukast 10 MG tablet  Commonly known as:  SINGULAIR  TAKE 1 TABLET BY MOUTH AT BEDTIME     predniSONE 5 MG tablet  Commonly known as:  DELTASONE  TAKE 1 TABLET DAILY OR AS DIRECTED     valACYclovir 500 MG tablet  Commonly known as:  VALTREX  TAKE 1 TABLET BY MOUTH TWICE A DAY       No Known Allergies

## 2015-07-08 NOTE — Telephone Encounter (Signed)
lmtcb for pt.  

## 2015-07-08 NOTE — Telephone Encounter (Signed)
She can check her insurance formulary for albuterol rescue inhalers- it will be from the group of ventolin, proventil, proair. She is given that formulary, we and the pharmacy are not.

## 2015-07-09 NOTE — Telephone Encounter (Signed)
Offer Proair HFA   # 1, Inhale 2 puffs every 6 hours as needed- rescue   Refill x 12

## 2015-07-09 NOTE — Telephone Encounter (Signed)
LMTCB x1 for pt Need to see which pharmacy pt prefers.

## 2015-07-09 NOTE — Telephone Encounter (Signed)
Called spoke with pt. She reports they will cover either proair HFA or proair respiclick (both on formulary). Please advise Dr. Maple Hudson thanks

## 2015-07-12 NOTE — Telephone Encounter (Signed)
lmtcb X2 for pt to verify pharmacy before sending proair

## 2015-07-13 NOTE — Telephone Encounter (Signed)
lmtcb x3 for pt. 

## 2015-07-14 NOTE — Telephone Encounter (Signed)
LMTCB x 4 for pt 

## 2015-08-29 ENCOUNTER — Other Ambulatory Visit: Payer: Self-pay | Admitting: Internal Medicine

## 2015-09-20 ENCOUNTER — Other Ambulatory Visit: Payer: Self-pay | Admitting: Internal Medicine

## 2015-10-22 ENCOUNTER — Other Ambulatory Visit: Payer: Self-pay | Admitting: Internal Medicine

## 2015-11-12 ENCOUNTER — Other Ambulatory Visit: Payer: Self-pay | Admitting: Internal Medicine

## 2015-11-16 ENCOUNTER — Other Ambulatory Visit: Payer: Self-pay | Admitting: Internal Medicine

## 2015-12-04 ENCOUNTER — Other Ambulatory Visit: Payer: Self-pay | Admitting: Internal Medicine

## 2015-12-29 ENCOUNTER — Other Ambulatory Visit: Payer: Self-pay | Admitting: Internal Medicine

## 2016-01-20 IMAGING — CR DG CHEST 2V
2 series · 2 of 2 positions shown · non-contrast
Comparison: Chest x-ray of 12/12/2007

CLINICAL DATA: Wheezing, cough, short of breath for a month

EXAM:
CHEST  2 VIEW

[view not recorded (1 of 2)]
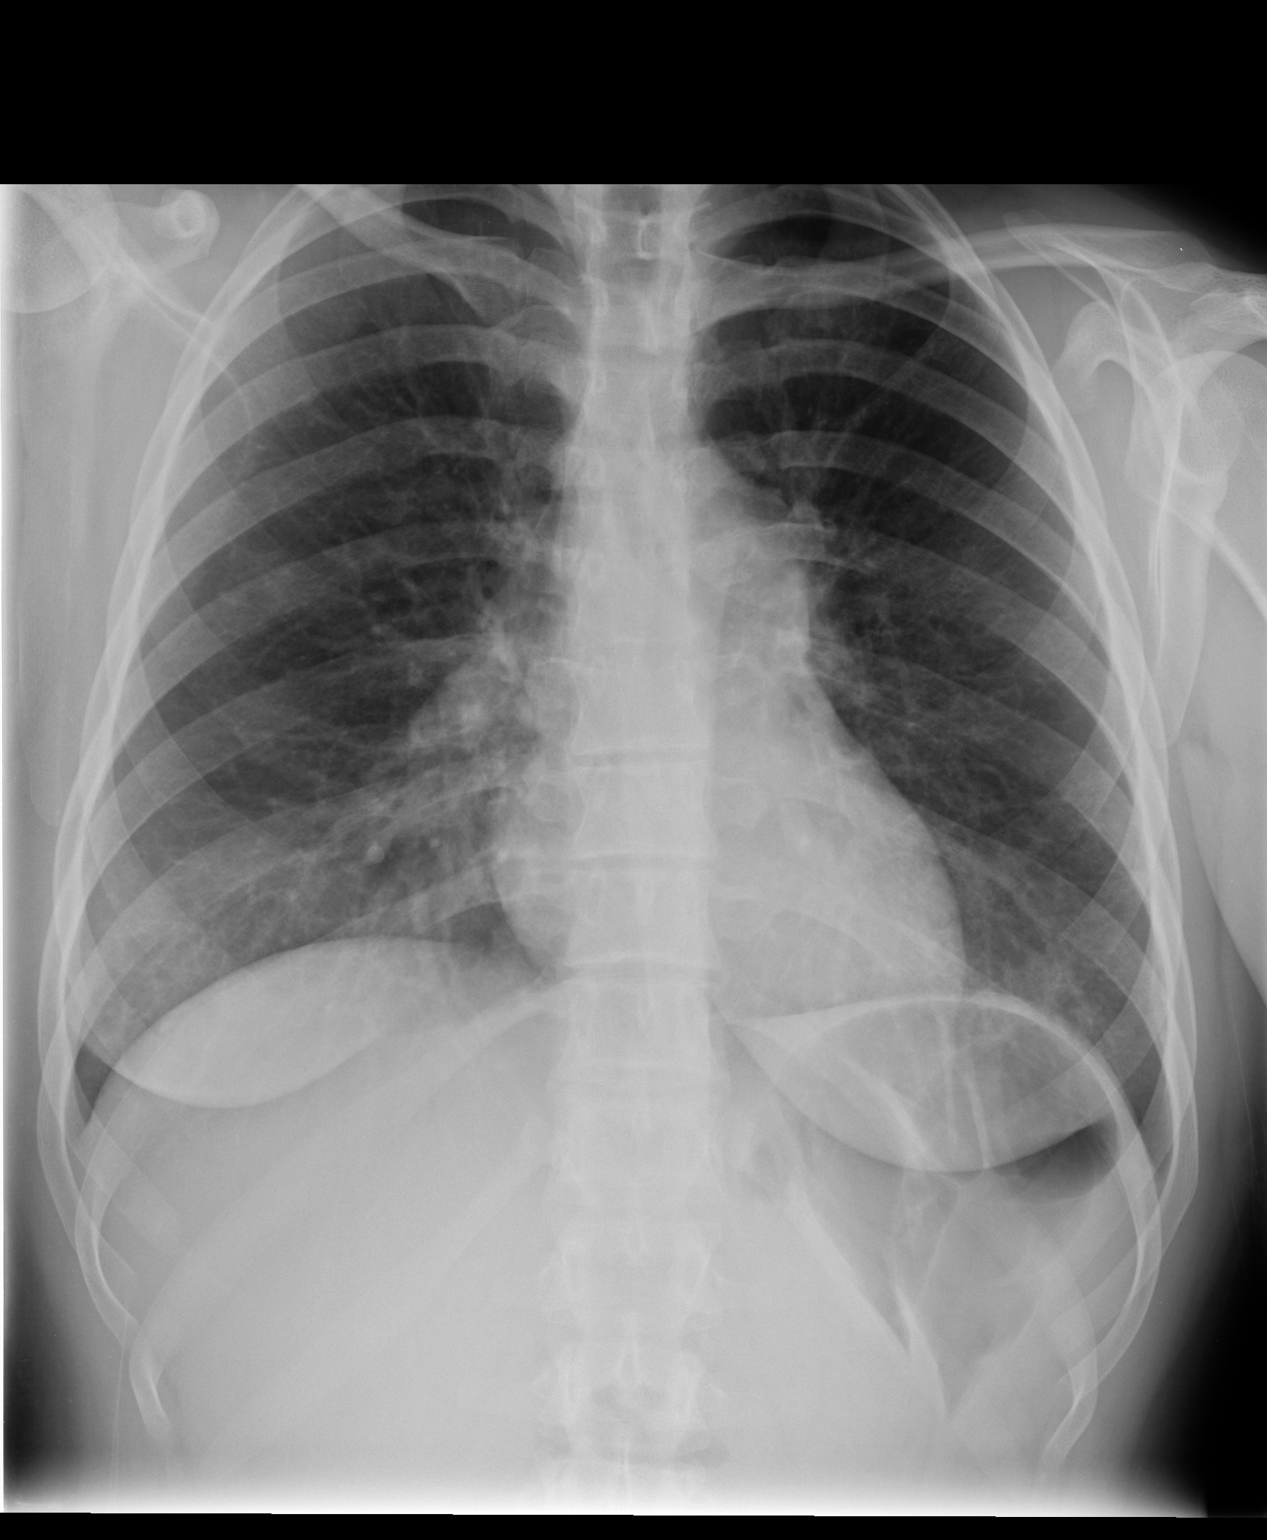

[view not recorded (2 of 2)]
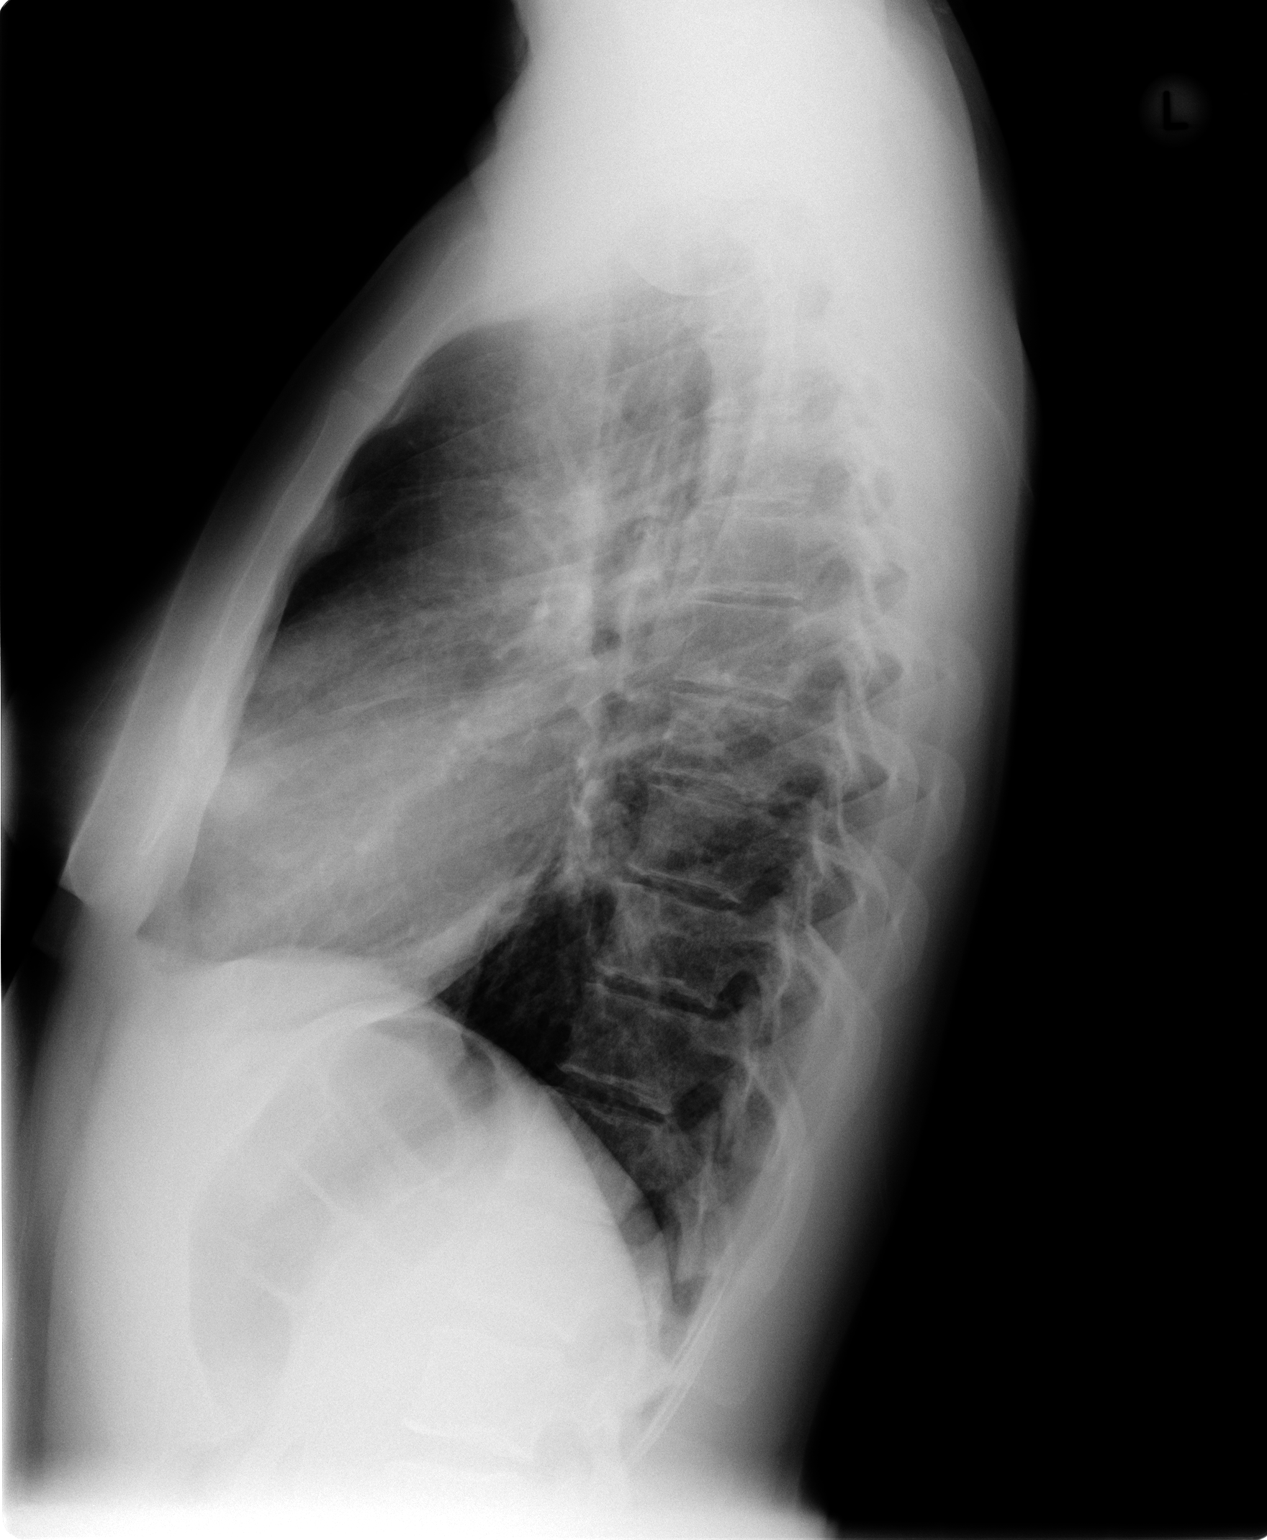

[2 of 2 positions shown; findings below may reference images not displayed]

FINDINGS: No active infiltrate or effusion is seen. Mediastinal and hilar
contours are unchanged with slight prominence of the left pulmonary
artery segment being stable. The heart is within normal limits in
size. No bony abnormality is seen.
IMPRESSION: No active cardiopulmonary disease.

## 2016-06-20 ENCOUNTER — Other Ambulatory Visit: Payer: Self-pay | Admitting: Internal Medicine

## 2016-08-10 ENCOUNTER — Telehealth: Payer: Self-pay | Admitting: Internal Medicine

## 2016-08-10 MED ORDER — HYDROXYZINE PAMOATE 50 MG PO CAPS: 50.0000 mg | ORAL_CAPSULE | Freq: Three times a day (TID) | ORAL | 0 refills | Status: DC | PRN

## 2016-08-10 MED ORDER — TRAZODONE HCL 100 MG PO TABS: 100.0000 mg | ORAL_TABLET | Freq: Every evening | ORAL | 0 refills | Status: DC | PRN

## 2016-08-10 MED ORDER — BUPROPION HCL ER (SR) 150 MG PO TB12: 150.0000 mg | ORAL_TABLET | Freq: Two times a day (BID) | ORAL | 0 refills | Status: DC

## 2016-08-10 MED ORDER — NALTREXONE HCL 50 MG PO TABS: 50.0000 mg | ORAL_TABLET | Freq: Every day | ORAL | 0 refills | Status: DC

## 2016-08-10 NOTE — Telephone Encounter (Signed)
E-scribed  to [facility] [address] in [place] [phone] the following Rxs:  Naltrexone 50 mg #14 one po daily Trazadone 100 mg one po daily #14- pharmacy says last refill was January Vistaril 50 mg tid #42  Wellbutrin SR 150 mg bid # 28  Pt reports she has appt with Dr. Harvin Hazelony Bird, psychiatrist on March 21st. Attempted to call and confirm appt but office was closed today so left message.   Pt also requested Baclofen 20 mg tid for back pain- declined to refill as current pharmacy had no record of this.  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 So am declining refill she requested on Baclofen.  Did call [facility] to try to confirm discharge meds but they will not speak to me unless pt gives permission.  Reportedly, Marcelino DusterMichelle at [facility] dispenses meds to pt. Pt's mother is to go to [place] today to assist her in getting these meds.  . Discussed situation with father last evening and pt is aware of that conversation as she had called him frustrated she could not get anyone to prescribe her meds supposedly.  No refills on these meds given she has appt March 21st with psych.

## 2018-06-05 NOTE — L&D Delivery Note (Signed)
Operative Delivery Note At 10:18 PM a viable and healthy female was delivered via Vaginal, Vacuum Neurosurgeon).  Presentation: vertex; Position: Right,, Occiput,, Anterior; Station: +2.  Verbal consent: obtained from patient.  Risks and benefits discussed in detail.  Risks include, but are not limited to the risks of anesthesia, bleeding, infection, damage to maternal tissues, fetal cephalhematoma.  There is also the risk of inability to effect vaginal delivery of the head, or shoulder dystocia that cannot be resolved by established maneuvers, leading to the need for emergency cesarean section. Indication: repetitive deep variable decels not responsive to amnioinfusion APGAR: 9, 9; weight  .8lb 5 oz   Placenta status: spontaneous intact not sent, .   Cord:  with the following complications: .  Cord pH: n/a  Anesthesia:  Epidural, local Instruments: mushroom vacuum Episiotomy: None Lacerations:  Right  Vaginal Sulcus;Labial right minora Suture Repair: 3.0 chromic Est. Blood Loss (mL):  331  Mom to postpartum.  Baby to Couplet care / Skin to Skin.  Corinthia Helmers A Carolanne Mercier 01/01/2019, 10:56 PM

## 2018-11-25 LAB — OB RESULTS CONSOLE GC/CHLAMYDIA
Chlamydia: NEGATIVE
Gonorrhea: NEGATIVE

## 2018-11-25 LAB — OB RESULTS CONSOLE GBS: GBS: NEGATIVE

## 2018-11-25 LAB — OB RESULTS CONSOLE ABO/RH: RH Type: POSITIVE

## 2018-11-25 LAB — OB RESULTS CONSOLE RUBELLA ANTIBODY, IGM: Rubella: IMMUNE

## 2018-11-25 LAB — OB RESULTS CONSOLE HEPATITIS B SURFACE ANTIGEN: Hepatitis B Surface Ag: NEGATIVE

## 2018-11-25 LAB — OB RESULTS CONSOLE HIV ANTIBODY (ROUTINE TESTING): HIV: NONREACTIVE

## 2018-11-25 LAB — OB RESULTS CONSOLE RPR: RPR: NONREACTIVE

## 2018-11-25 LAB — OB RESULTS CONSOLE ANTIBODY SCREEN: Antibody Screen: NEGATIVE

## 2018-12-02 ENCOUNTER — Inpatient Hospital Stay (HOSPITAL_COMMUNITY)
Admission: AD | Admit: 2018-12-02 | Discharge: 2018-12-02 | Disposition: A | Discharge status: Home / Self Care | Payer: BC Managed Care – PPO | Attending: Obstetrics and Gynecology | Admitting: Obstetrics and Gynecology

## 2018-12-02 ENCOUNTER — Other Ambulatory Visit: Payer: Self-pay | Admitting: Obstetrics and Gynecology

## 2018-12-02 ENCOUNTER — Encounter (HOSPITAL_COMMUNITY): Payer: Self-pay | Admitting: *Deleted

## 2018-12-02 ENCOUNTER — Other Ambulatory Visit: Payer: Self-pay

## 2018-12-02 DIAGNOSIS — O133 Gestational [pregnancy-induced] hypertension without significant proteinuria, third trimester: Secondary | ICD-10-CM

## 2018-12-02 DIAGNOSIS — Z3A36 36 weeks gestation of pregnancy: Secondary | ICD-10-CM | POA: Diagnosis not present

## 2018-12-02 DIAGNOSIS — O1203 Gestational edema, third trimester: Secondary | ICD-10-CM | POA: Diagnosis present

## 2018-12-02 LAB — COMPREHENSIVE METABOLIC PANEL
ALT: 12 U/L (ref 0–44)
AST: 20 U/L (ref 15–41)
Albumin: 2.9 g/dL — ABNORMAL LOW (ref 3.5–5.0)
Alkaline Phosphatase: 83 U/L (ref 38–126)
Anion gap: 9 (ref 5–15)
BUN: 6 mg/dL (ref 6–20)
CO2: 19 mmol/L — ABNORMAL LOW (ref 22–32)
Calcium: 8.2 mg/dL — ABNORMAL LOW (ref 8.9–10.3)
Chloride: 109 mmol/L (ref 98–111)
Creatinine, Ser: 0.54 mg/dL (ref 0.44–1.00)
GFR calc Af Amer: >60 mL/min (ref >60)
GFR calc non Af Amer: >60 mL/min (ref >60)
Glucose, Bld: 90 mg/dL (ref 70–99)
Potassium: 3.7 mmol/L (ref 3.5–5.1)
Sodium: 137 mmol/L (ref 135–145)
Total Bilirubin: 0.3 mg/dL (ref 0.3–1.2)
Total Protein: 6.4 g/dL — ABNORMAL LOW (ref 6.5–8.1)

## 2018-12-02 LAB — CBC WITH DIFFERENTIAL/PLATELET
Abs Immature Granulocytes: 0.02 10*3/uL (ref 0.00–0.07)
Basophils Absolute: 0 10*3/uL (ref 0.0–0.1)
Basophils Relative: 0 %
Eosinophils Absolute: 0.2 10*3/uL (ref 0.0–0.5)
Eosinophils Relative: 2 %
HCT: 38.4 % (ref 36.0–46.0)
Hemoglobin: 12.6 g/dL (ref 12.0–15.0)
Immature Granulocytes: 0 %
Lymphocytes Relative: 16 %
Lymphs Abs: 1.2 10*3/uL (ref 0.7–4.0)
MCH: 28.7 pg (ref 26.0–34.0)
MCHC: 32.8 g/dL (ref 30.0–36.0)
MCV: 87.5 fL (ref 80.0–100.0)
Monocytes Absolute: 0.6 10*3/uL (ref 0.1–1.0)
Monocytes Relative: 7 %
Neutro Abs: 5.8 10*3/uL (ref 1.7–7.7)
Neutrophils Relative %: 75 %
Platelets: 152 10*3/uL (ref 150–400)
RBC: 4.39 MIL/uL (ref 3.87–5.11)
RDW: 13 % (ref 11.5–15.5)
WBC: 7.8 10*3/uL (ref 4.0–10.5)
nRBC: 0 % (ref 0.0–0.2)

## 2018-12-02 LAB — PROTEIN / CREATININE RATIO, URINE
Creatinine, Urine: 135.79 mg/dL
Protein Creatinine Ratio: 0.12 mg/mg{Cre} (ref 0.00–0.15)
Total Protein, Urine: 16 mg/dL

## 2018-12-02 LAB — URIC ACID: Uric Acid, Serum: 4.5 mg/dL (ref 2.5–7.1)

## 2018-12-02 NOTE — MAU Provider Note (Signed)
History     Chief Complaint  Patient presents with  . Hypertension  28 yo G1P0 SF @ 36 2/7 wks presents for routine OB care with BP 130/90, 5lb weight gain since last wk as well as leg swelling. Denies h/a, visual changes. Prior office visit had elev BP and PIH labs were normal 6/22   OB History    Gravida  1   Para      Term      Preterm      AB      Living        SAB      TAB      Ectopic      Multiple      Live Births              Past Medical History:  Diagnosis Date  . ADD (attention deficit disorder)   . Asthma   . HSV-1 (herpes simplex virus 1) infection     History reviewed. No pertinent surgical history.  Family History  Problem Relation Age of Onset  . Asthma Father   . Allergies Father   . Pancreatic cancer Maternal Grandmother     Social History   Tobacco Use  . Smoking status: Never Smoker  . Smokeless tobacco: Never Used  . Tobacco comment: passive exposure-has tired in the past  Substance Use Topics  . Alcohol use: Yes    Comment: beer once a week on avg  . Drug use: No    Allergies: No Known Allergies  Medications Prior to Admission  Medication Sig Dispense Refill Last Dose  . ADVAIR HFA 230-21 MCG/ACT inhaler INHALE 2 PUFFS INTO THE LUNGS 2 TIMES DAILY, RINSE MOUTH AFTER USE 1 Inhaler 11 Past Month at Unknown time  . albuterol (PROVENTIL) (2.5 MG/3ML) 0.083% nebulizer solution Take 3 mLs (2.5 mg total) by nebulization every 6 (six) hours as needed for wheezing or shortness of breath. 75 mL 12 Past Month at Unknown time  . ADVAIR HFA 115-21 MCG/ACT inhaler   12   . amphetamine-dextroamphetamine (ADDERALL XR) 30 MG 24 hr capsule Take 1 capsule (30 mg total) by mouth every morning. Fill after10/30/2015 30 capsule 0   . buPROPion (WELLBUTRIN SR) 150 MG 12 hr tablet Take 1 tablet (150 mg total) by mouth 2 (two) times daily. 28 tablet 0   . hydrOXYzine (VISTARIL) 50 MG capsule Take 1 capsule (50 mg total) by mouth 3 (three) times  daily as needed. 30 capsule 0   . JUNEL 1.5/30 1.5-30 MG-MCG tablet Take 1 tablet by mouth daily.  12   . JUNEL FE 1.5/30 1.5-30 MG-MCG tablet TAKE 1 TABLET BY MOUTH DAILY. 28 tablet 1   . montelukast (SINGULAIR) 10 MG tablet TAKE 1 TABLET BY MOUTH AT BEDTIME 30 tablet 11   . naltrexone (DEPADE) 50 MG tablet Take 1 tablet (50 mg total) by mouth daily. 14 tablet 0   . Nebulizers (COMPRESSOR/NEBULIZER) MISC Use as directed 1 each prn   . predniSONE (DELTASONE) 5 MG tablet TAKE 1 TABLET DAILY OR AS DIRECTED 30 tablet 3   . PROAIR HFA 108 (90 Base) MCG/ACT inhaler INHALE 2 PUFFS 4 TIMES DAILY FOR 2-4 WEEKS 18 Inhaler 1   . traZODone (DESYREL) 100 MG tablet Take 1 tablet (100 mg total) by mouth at bedtime as needed for sleep. 14 tablet 0   . valACYclovir (VALTREX) 500 MG tablet TAKE 1 TABLET BY MOUTH TWICE A DAY 10 tablet prn More than a  month at Unknown time     Physical Exam   Blood pressure 136/72, pulse 85, last menstrual period 03/23/2018, SpO2 98 %.  Patient Vitals for the past 24 hrs:  BP Pulse SpO2  12/02/18 1331 136/72 85 -  12/02/18 1330 - - 98 %  12/02/18 1315 (!) 141/74 70 98 %  12/02/18 1301 126/65 78 -  12/02/18 1253 130/64 86 -    No exam performed today, done in office.  Tracing: baseline 120 (+) accel to 160 rare ctx ED Course  IMP; transient HTN in third trimester IUP @ 36 2/7 wk P) PIH labs. Serial BP MDM Addendum CBC    Component Value Date/Time   WBC 7.8 12/02/2018 1314   RBC 4.39 12/02/2018 1314   HGB 12.6 12/02/2018 1314   HCT 38.4 12/02/2018 1314   PLT 152 12/02/2018 1314   MCV 87.5 12/02/2018 1314   MCH 28.7 12/02/2018 1314   MCHC 32.8 12/02/2018 1314   RDW 13.0 12/02/2018 1314   LYMPHSABS 1.2 12/02/2018 1314   MONOABS 0.6 12/02/2018 1314   EOSABS 0.2 12/02/2018 1314   BASOSABS 0.0 12/02/2018 1314   CMP Latest Ref Rng & Units 12/02/2018  Glucose 70 - 99 mg/dL 90  BUN 6 - 20 mg/dL 6  Creatinine 0.44 - 1.00 mg/dL 0.54  Sodium 135 - 145 mmol/L  137  Potassium 3.5 - 5.1 mmol/L 3.7  Chloride 98 - 111 mmol/L 109  CO2 22 - 32 mmol/L 19(L)  Calcium 8.9 - 10.3 mg/dL 8.2(L)  Total Protein 6.5 - 8.1 g/dL 6.4(L)  Total Bilirubin 0.3 - 1.2 mg/dL 0.3  Alkaline Phos 38 - 126 U/L 83  AST 15 - 41 U/L 20  ALT 0 - 44 U/L 12   Urine protein/creatinine 0.12 D/c home.  Keep OB appt  preeclampsia warning signs Marvene Staff, MD 1:36 PM 12/02/2018

## 2018-12-02 NOTE — Discharge Instructions (Signed)
Preeclampsia and Eclampsia °Preeclampsia is a serious condition that may develop during pregnancy. This condition causes high blood pressure and increased protein in your urine along with other symptoms, such as headaches and vision changes. These symptoms may develop as the condition gets worse. Preeclampsia may occur at 20 weeks of pregnancy or later. °Diagnosing and treating preeclampsia early is very important. If not treated early, it can cause serious problems for you and your baby. One problem it can lead to is eclampsia. Eclampsia is a condition that causes muscle jerking or shaking (convulsions or seizures) and other serious problems for the mother. During pregnancy, delivering your baby may be the best treatment for preeclampsia or eclampsia. For most women, preeclampsia and eclampsia symptoms go away after giving birth. °In rare cases, a woman may develop preeclampsia after giving birth (postpartum preeclampsia). This usually occurs within 48 hours after childbirth but may occur up to 6 weeks after giving birth. °What are the causes? °The cause of preeclampsia is not known. °What increases the risk? °The following risk factors make you more likely to develop preeclampsia: °· Being pregnant for the first time. °· Having had preeclampsia during a past pregnancy. °· Having a family history of preeclampsia. °· Having high blood pressure. °· Being pregnant with more than one baby. °· Being 35 or older. °· Being African-American. °· Having kidney disease or diabetes. °· Having medical conditions such as lupus or blood diseases. °· Being very overweight (obese). °What are the signs or symptoms? °The most common symptoms are: °· Severe headaches. °· Vision problems, such as blurred or double vision. °· Abdominal pain, especially upper abdominal pain. °Other symptoms that may develop as the condition gets worse include: °· Sudden weight gain. °· Sudden swelling of the hands, face, legs, and feet. °· Severe nausea  and vomiting. °· Numbness in the face, arms, legs, and feet. °· Dizziness. °· Urinating less than usual. °· Slurred speech. °· Convulsions or seizures. °How is this diagnosed? °There are no screening tests for preeclampsia. Your health care provider will ask you about symptoms and check for signs of preeclampsia during your prenatal visits. You may also have tests that include: °· Checking your blood pressure. °· Urine tests to check for protein. Your health care provider will check for this at every prenatal visit. °· Blood tests. °· Monitoring your baby's heart rate. °· Ultrasound. °How is this treated? °You and your health care provider will determine the treatment approach that is best for you. Treatment may include: °· Having more frequent prenatal exams to check for signs of preeclampsia, if you have an increased risk for preeclampsia. °· Medicine to lower your blood pressure. °· Staying in the hospital, if your condition is severe. There, treatment will focus on controlling your blood pressure and the amount of fluids in your body (fluid retention). °· Taking medicine (magnesium sulfate) to prevent seizures. This may be given as an injection or through an IV. °· Taking a low-dose aspirin during your pregnancy. °· Delivering your baby early. You may have your labor started with medicine (induced), or you may have a cesarean delivery. °Follow these instructions at home: °Eating and drinking ° °· Drink enough fluid to keep your urine pale yellow. °· Avoid caffeine. °Lifestyle °· Do not use any products that contain nicotine or tobacco, such as cigarettes and e-cigarettes. If you need help quitting, ask your health care provider. °· Do not use alcohol or drugs. °· Avoid stress as much as possible. Rest and get   plenty of sleep. °General instructions °· Take over-the-counter and prescription medicines only as told by your health care provider. °· When lying down, lie on your left side. This keeps pressure off your  major blood vessels. °· When sitting or lying down, raise (elevate) your feet. Try putting some pillows underneath your lower legs. °· Exercise regularly. Ask your health care provider what kinds of exercise are best for you. °· Keep all follow-up and prenatal visits as told by your health care provider. This is important. °How is this prevented? °There is no known way of preventing preeclampsia or eclampsia from developing. However, to lower your risk of complications and detect problems early: °· Get regular prenatal care. Your health care provider may be able to diagnose and treat the condition early. °· Maintain a healthy weight. Ask your health care provider for help managing weight gain during pregnancy. °· Work with your health care provider to manage any long-term (chronic) health conditions you have, such as diabetes or kidney problems. °· You may have tests of your blood pressure and kidney function after giving birth. °· Your health care provider may have you take low-dose aspirin during your next pregnancy. °Contact a health care provider if: °· You have symptoms that your health care provider told you may require more treatment or monitoring, such as: °? Headaches. °? Nausea or vomiting. °? Abdominal pain. °? Dizziness. °? Light-headedness. °Get help right away if: °· You have severe: °? Abdominal pain. °? Headaches that do not get better. °? Dizziness. °? Vision problems. °? Confusion. °? Nausea or vomiting. °· You have any of the following: °? A seizure. °? Sudden, rapid weight gain. °? Sudden swelling in your hands, ankles, or face. °? Trouble moving any part of your body. °? Numbness in any part of your body. °? Trouble speaking. °? Abnormal bleeding. °· You faint. °Summary °· Preeclampsia is a serious condition that may develop during pregnancy. °· This condition causes high blood pressure and increased protein in your urine along with other symptoms, such as headaches and vision  changes. °· Diagnosing and treating preeclampsia early is very important. If not treated early, it can cause serious problems for you and your baby. °· Get help right away if you have symptoms that your health care provider told you to watch for. °This information is not intended to replace advice given to you by your health care provider. Make sure you discuss any questions you have with your health care provider. °Document Released: 05/19/2000 Document Revised: 01/22/2018 Document Reviewed: 12/27/2015 °Elsevier Patient Education © 2020 [employer] ° °

## 2018-12-02 NOTE — MAU Note (Addendum)
Pt sent from office monitoring for increase in blood pressure with a 5 pound weight gain in a week. Denies any headache of blurred vision.

## 2018-12-14 ENCOUNTER — Encounter (HOSPITAL_COMMUNITY): Payer: Self-pay | Admitting: *Deleted

## 2018-12-14 ENCOUNTER — Emergency Department (HOSPITAL_COMMUNITY)
Admission: EM | Admit: 2018-12-14 | Discharge: 2018-12-15 | Disposition: A | Discharge status: Home / Self Care | Payer: BC Managed Care – PPO | Attending: Emergency Medicine | Admitting: Emergency Medicine

## 2018-12-14 ENCOUNTER — Other Ambulatory Visit: Payer: Self-pay

## 2018-12-14 DIAGNOSIS — Z3A38 38 weeks gestation of pregnancy: Secondary | ICD-10-CM | POA: Insufficient documentation

## 2018-12-14 DIAGNOSIS — J45909 Unspecified asthma, uncomplicated: Secondary | ICD-10-CM | POA: Insufficient documentation

## 2018-12-14 DIAGNOSIS — N764 Abscess of vulva: Secondary | ICD-10-CM | POA: Diagnosis not present

## 2018-12-14 DIAGNOSIS — L0291 Cutaneous abscess, unspecified: Secondary | ICD-10-CM

## 2018-12-14 DIAGNOSIS — O9989 Other specified diseases and conditions complicating pregnancy, childbirth and the puerperium: Secondary | ICD-10-CM | POA: Insufficient documentation

## 2018-12-14 NOTE — ED Triage Notes (Signed)
Pt reports having a cyst or abscess on vagina. Pt is [redacted] weeks pregnant. Denies any drainage from area or fluid from vagina.

## 2018-12-15 MED ORDER — CLINDAMYCIN HCL 150 MG PO CAPS: 300.0000 mg | ORAL_CAPSULE | Freq: Three times a day (TID) | ORAL | 0 refills | Status: AC

## 2018-12-15 MED ORDER — LIDOCAINE HCL (PF) 1 % IJ SOLN
10.0000 mL | Freq: Once | INTRAMUSCULAR | Status: AC
  Administered 2018-12-15: 03:00:00 10 mL
  Filled 2018-12-15: qty 10

## 2018-12-15 NOTE — ED Provider Notes (Signed)
[facility]   CSN: [id number] Arrival date & time: 12/14/18  2322     History   Chief Complaint Chief Complaint  Patient presents with  . Abscess    HPI Cruzita LedererMegan C Blumenstock is a 28 y.o. female presenting for evaluation of vaginal abscess.  Patient states for the past 3 to 4 days, she has had a gradually growing and painful area of her left vaginal area.  She denies drainage.  She has not taken anything for this including Tylenol.  Patient states she is concerned because she is [redacted] weeks pregnant, does not want infection to spread.  She has tried a sitz bath without improvement.  Patient states she has had multiple times or her blood pressure has been elevated during her pregnancy, but she has not been started on any medication and they are just observing this for now.  No h/o abscesses/boils/HS. She denies fevers, chills, n/v.     HPI  Past Medical History:  Diagnosis Date  . ADD (attention deficit disorder)   . Asthma   . HSV-1 (herpes simplex virus 1) infection     Patient Active Problem List   Diagnosis Date Noted  . Asthma, moderate persistent 06/03/2014  . Seasonal and perennial allergic rhinitis 02/24/2014  . Attention deficit disorder 01/16/2011    History reviewed. No pertinent surgical history.   OB History    Gravida  1   Para      Term      Preterm      AB      Living        SAB      TAB      Ectopic      Multiple      Live Births               Home Medications    Prior to Admission medications   Medication Sig Start Date End Date Taking? Authorizing Provider  [phone]ACT [facility]  06/03/14   [provider]  ADVAIR HFA 230-21 MCG/ACT inhaler INHALE 2 PUFFS INTO THE LUNGS 2 TIMES DAILY, RINSE MOUTH AFTER USE 10/22/15   Jetty DuhamelYoung, Clinton D, MD  albuterol (PROVENTIL) (2.5 MG/3ML) 0.083% nebulizer solution Take 3 mLs (2.5 mg total) by nebulization every 6 (six) hours  as needed for wheezing or shortness of breath. 02/24/14   Waymon BudgeYoung, Clinton D, MD  clindamycin (CLEOCIN) 150 MG capsule Take 2 capsules (300 mg total) by mouth 3 (three) times daily for 5 days. 12/15/18 12/20/18  Ashwin Tibbs, PA-C  JUNEL 1.5/30 1.5-30 MG-MCG tablet Take 1 tablet by mouth daily. 08/05/14   [provider]  montelukast (SINGULAIR) 10 MG tablet TAKE 1 TABLET BY MOUTH AT BEDTIME 03/03/15   Young, Joni Fearslinton D, MD  naltrexone (DEPADE) 50 MG tablet Take 1 tablet (50 mg total) by mouth daily. 08/10/16   Margaree MackintoshBaxley, Mary J, MD  Nebulizers (COMPRESSOR/NEBULIZER) MISC Use as directed 05/04/14   Waymon BudgeYoung, Clinton D, MD  PROAIR HFA [phone](90 Base) MCG/ACT inhaler INHALE 2 PUFFS 4 TIMES DAILY FOR 2-4 WEEKS 12/30/15   Waymon BudgeYoung, Clinton D, MD  valACYclovir (VALTREX) 500 MG tablet TAKE 1 TABLET BY MOUTH TWICE A DAY 07/16/14   Margaree MackintoshBaxley, Mary J, MD    Family History Family History  Problem Relation Age of Onset  . Asthma Father   . Allergies Father   . Pancreatic cancer Maternal Grandmother     Social History Social History  Tobacco Use  . Smoking status: Never Smoker  . Smokeless tobacco: Never Used  . Tobacco comment: passive exposure-has tired in the past  Substance Use Topics  . Alcohol use: Yes    Comment: beer once a week on avg  . Drug use: No     Allergies   Patient has no known allergies.   Review of Systems Review of Systems  Constitutional: Negative for fever.  Genitourinary:       Vaginal abscess     Physical Exam Updated Vital Signs BP 138/90   Pulse 78   Temp 98 F (36.7 C) (Oral)   Resp 20   LMP 03/23/2018   SpO2 98%   Physical Exam Vitals signs and nursing note reviewed. Exam conducted with a chaperone present.  Constitutional:      General: She is not in acute distress.    Appearance: She is well-developed.     Comments: Appears nontoxic  HENT:     Head: Normocephalic and atraumatic.  Neck:     Musculoskeletal: Normal range of motion.  Cardiovascular:      Rate and Rhythm: Normal rate and regular rhythm.     Pulses: Normal pulses.  Pulmonary:     Effort: Pulmonary effort is normal.  Abdominal:     Tenderness: There is no abdominal tenderness.  Genitourinary:      Comments: Abscess of the left side of the mons where the leg meets the mons.  No labial involvement.  No active drainage. Musculoskeletal: Normal range of motion.  Skin:    General: Skin is warm.     Findings: No rash.  Neurological:     Mental Status: She is alert and oriented to person, place, and time.      ED Treatments / Results  Labs (all labs ordered are listed, but only abnormal results are displayed) Labs Reviewed - No data to display  EKG None  Radiology No results found.  Procedures .Marland KitchenIncision and Drainage  Date/Time: 12/15/2018 3:35 AM Performed by: Franchot Heidelberg, PA-C Authorized by: Franchot Heidelberg, PA-C   Consent:    Consent obtained:  Verbal   Consent given by:  Patient   Risks discussed:  Bleeding, damage to other organs, incomplete drainage, infection and pain Location:    Type:  Abscess   Location:  Anogenital   Anogenital location:  Vulva Pre-procedure details:    Skin preparation:  Chloraprep Anesthesia (see MAR for exact dosages):    Anesthesia method:  Local infiltration   Local anesthetic:  Lidocaine 1% w/o epi Procedure type:    Complexity:  Simple Procedure details:    Incision types:  Single straight   Incision depth:  Dermal   Scalpel blade:  11   Wound management:  Probed and deloculated   Drainage:  Purulent and bloody   Drainage amount:  Moderate   Wound treatment:  Wound left open   Packing materials:  None Post-procedure details:    Patient tolerance of procedure:  Tolerated well, no immediate complications   (including critical care time)  Medications Ordered in ED Medications  lidocaine (PF) (XYLOCAINE) 1 % injection 10 mL (10 mLs Infiltration Given by Other 12/15/18 0230)     Initial Impression  / Assessment and Plan / ED Course  I have reviewed the triage vital signs and the nursing notes.  Pertinent labs & imaging results that were available during my care of the patient were reviewed by me and considered in my medical decision making (see chart for  details).        Pt presenting for evaluation of vaginal abscess.  Physical exam reassuring, no signs of systemic infection.  Abscess is towards the left of the mons, not involving the labia.  Discussed with attending, Dr. Clayborne DanaMesner agrees to plan.  Will perform I&D.  I&D performed as described above.  Discussed aftercare instructions.  Due to location of patient's pregnancy, started on a short course of clindamycin to prevent further infection.  Encourage follow-up with OB/GYN.  At this time, patient appears safe for discharge.  Return precautions given.  Patient states she understands and agrees to plan.  Final Clinical Impressions(s) / ED Diagnoses   Final diagnoses:  Abscess    ED Discharge Orders         Ordered    clindamycin (CLEOCIN) 150 MG capsule  3 times daily     12/15/18 0301           Alveria ApleyCaccavale, Miko Markwood, PA-C 12/15/18 0336    Mesner, Barbara CowerJason, MD 12/15/18 [phone]

## 2018-12-15 NOTE — Discharge Instructions (Addendum)
Take antibiotics as prescribed.  Take entire course, even if your symptoms.. Use Tylenol as needed for pain. Use warm compresses to help with pain and swelling. Follow-up with your OB/GYN for further evaluation. Return to the emergency room develop high fevers, severe worsening symptoms, or any new or concerning symptoms.

## 2018-12-15 NOTE — ED Notes (Signed)
ED Provider at bedside. 

## 2018-12-15 NOTE — ED Notes (Signed)
Discharge instructions discussed with pt. Pt verbalized understanding. Pt stable and ambulatory. Signature pad broken

## 2018-12-30 ENCOUNTER — Other Ambulatory Visit: Payer: Self-pay | Admitting: Obstetrics and Gynecology

## 2019-01-01 ENCOUNTER — Inpatient Hospital Stay (HOSPITAL_COMMUNITY): Payer: BC Managed Care – PPO

## 2019-01-01 ENCOUNTER — Encounter (HOSPITAL_COMMUNITY): Payer: Self-pay | Admitting: Obstetrics

## 2019-01-01 ENCOUNTER — Inpatient Hospital Stay (HOSPITAL_COMMUNITY): Payer: BC Managed Care – PPO | Admitting: Anesthesiology

## 2019-01-01 ENCOUNTER — Inpatient Hospital Stay (HOSPITAL_COMMUNITY)
Admission: AD | Admit: 2019-01-01 | Discharge: 2019-01-03 | DRG: 806 | Disposition: A | Discharge status: Home / Self Care | Payer: BC Managed Care – PPO | Attending: Obstetrics and Gynecology | Admitting: Obstetrics and Gynecology

## 2019-01-01 ENCOUNTER — Other Ambulatory Visit: Payer: Self-pay

## 2019-01-01 DIAGNOSIS — O480 Post-term pregnancy: Principal | ICD-10-CM | POA: Diagnosis present

## 2019-01-01 DIAGNOSIS — O99214 Obesity complicating childbirth: Secondary | ICD-10-CM | POA: Diagnosis present

## 2019-01-01 DIAGNOSIS — J4540 Moderate persistent asthma, uncomplicated: Secondary | ICD-10-CM | POA: Diagnosis present

## 2019-01-01 DIAGNOSIS — Z20828 Contact with and (suspected) exposure to other viral communicable diseases: Secondary | ICD-10-CM | POA: Diagnosis present

## 2019-01-01 DIAGNOSIS — O9952 Diseases of the respiratory system complicating childbirth: Secondary | ICD-10-CM | POA: Diagnosis present

## 2019-01-01 DIAGNOSIS — O9912 Other diseases of the blood and blood-forming organs and certain disorders involving the immune mechanism complicating childbirth: Secondary | ICD-10-CM | POA: Diagnosis present

## 2019-01-01 DIAGNOSIS — Z3A40 40 weeks gestation of pregnancy: Secondary | ICD-10-CM | POA: Diagnosis not present

## 2019-01-01 DIAGNOSIS — O134 Gestational [pregnancy-induced] hypertension without significant proteinuria, complicating childbirth: Secondary | ICD-10-CM | POA: Diagnosis present

## 2019-01-01 DIAGNOSIS — Z3490 Encounter for supervision of normal pregnancy, unspecified, unspecified trimester: Secondary | ICD-10-CM | POA: Diagnosis present

## 2019-01-01 DIAGNOSIS — D696 Thrombocytopenia, unspecified: Secondary | ICD-10-CM | POA: Diagnosis present

## 2019-01-01 DIAGNOSIS — Z8759 Personal history of other complications of pregnancy, childbirth and the puerperium: Secondary | ICD-10-CM

## 2019-01-01 LAB — COMPREHENSIVE METABOLIC PANEL
ALT: 9 U/L (ref 0–44)
AST: 20 U/L (ref 15–41)
Albumin: 3 g/dL — ABNORMAL LOW (ref 3.5–5.0)
Alkaline Phosphatase: 110 U/L (ref 38–126)
Anion gap: 12 (ref 5–15)
BUN: 8 mg/dL (ref 6–20)
CO2: 17 mmol/L — ABNORMAL LOW (ref 22–32)
Calcium: 8.8 mg/dL — ABNORMAL LOW (ref 8.9–10.3)
Chloride: 107 mmol/L (ref 98–111)
Creatinine, Ser: 0.62 mg/dL (ref 0.44–1.00)
GFR calc Af Amer: >60 mL/min (ref >60)
GFR calc non Af Amer: >60 mL/min (ref >60)
Glucose, Bld: 97 mg/dL (ref 70–99)
Potassium: 3.8 mmol/L (ref 3.5–5.1)
Sodium: 136 mmol/L (ref 135–145)
Total Bilirubin: 0.6 mg/dL (ref 0.3–1.2)
Total Protein: 6.6 g/dL (ref 6.5–8.1)

## 2019-01-01 LAB — CBC
HCT: 39.6 % (ref 36.0–46.0)
Hemoglobin: 13.2 g/dL (ref 12.0–15.0)
MCH: 28.8 pg (ref 26.0–34.0)
MCHC: 33.3 g/dL (ref 30.0–36.0)
MCV: 86.5 fL (ref 80.0–100.0)
Platelets: 153 10*3/uL (ref 150–400)
RBC: 4.58 MIL/uL (ref 3.87–5.11)
RDW: 13.7 % (ref 11.5–15.5)
WBC: 6.8 10*3/uL (ref 4.0–10.5)
nRBC: 0 % (ref 0.0–0.2)

## 2019-01-01 LAB — TYPE AND SCREEN
ABO/RH(D): O POS
Antibody Screen: NEGATIVE

## 2019-01-01 LAB — PROTEIN / CREATININE RATIO, URINE
Creatinine, Urine: 343.24 mg/dL
Protein Creatinine Ratio: 0.1 mg/mg{Cre} (ref 0.00–0.15)
Total Protein, Urine: 33 mg/dL

## 2019-01-01 LAB — RPR: RPR Ser Ql: NONREACTIVE

## 2019-01-01 LAB — URIC ACID: Uric Acid, Serum: 5.4 mg/dL (ref 2.5–7.1)

## 2019-01-01 LAB — SARS CORONAVIRUS 2 BY RT PCR (HOSPITAL ORDER, PERFORMED IN ~~LOC~~ HOSPITAL LAB): SARS Coronavirus 2: NEGATIVE

## 2019-01-01 LAB — ABO/RH: ABO/RH(D): O POS

## 2019-01-01 MED ORDER — EPHEDRINE 5 MG/ML INJ: 10.0000 mg | INTRAVENOUS | Status: DC | PRN

## 2019-01-01 MED ORDER — LIDOCAINE HCL (PF) 1 % IJ SOLN
30.0000 mL | INTRAMUSCULAR | Status: AC | PRN
  Administered 2019-01-01: 30 mL via SUBCUTANEOUS
  Filled 2019-01-01: qty 30

## 2019-01-01 MED ORDER — ACETAMINOPHEN 325 MG PO TABS
650.0000 mg | ORAL_TABLET | ORAL | Status: DC | PRN
  Administered 2019-01-01: 650 mg via ORAL
  Filled 2019-01-01: qty 2

## 2019-01-01 MED ORDER — SOD CITRATE-CITRIC ACID 500-334 MG/5ML PO SOLN
30.0000 mL | ORAL | Status: DC | PRN
  Filled 2019-01-01: qty 30

## 2019-01-01 MED ORDER — LACTATED RINGERS AMNIOINFUSION
INTRAVENOUS | Status: DC
  Administered 2019-01-01: 22:00:00 via INTRAUTERINE

## 2019-01-01 MED ORDER — DIPHENHYDRAMINE HCL 50 MG/ML IJ SOLN
12.5000 mg | INTRAMUSCULAR | Status: DC | PRN
  Administered 2019-01-01 (×2): 12.5 mg via INTRAVENOUS
  Filled 2019-01-01 (×2): qty 1

## 2019-01-01 MED ORDER — FENTANYL-BUPIVACAINE-NACL 0.5-0.125-0.9 MG/250ML-% EP SOLN
12.0000 mL/h | EPIDURAL | Status: DC | PRN
  Filled 2019-01-01: qty 250

## 2019-01-01 MED ORDER — OXYTOCIN 40 UNITS IN NORMAL SALINE INFUSION - SIMPLE MED
1.0000 m[IU]/min | INTRAVENOUS | Status: DC
  Administered 2019-01-01: 2 m[IU]/min via INTRAVENOUS
  Filled 2019-01-01: qty 1000

## 2019-01-01 MED ORDER — TERBUTALINE SULFATE 1 MG/ML IJ SOLN
0.2500 mg | Freq: Once | INTRAMUSCULAR | Status: DC | PRN
  Filled 2019-01-01: qty 1

## 2019-01-01 MED ORDER — PHENYLEPHRINE 40 MCG/ML (10ML) SYRINGE FOR IV PUSH (FOR BLOOD PRESSURE SUPPORT): 80.0000 ug | PREFILLED_SYRINGE | INTRAVENOUS | Status: DC | PRN

## 2019-01-01 MED ORDER — LACTATED RINGERS IV SOLN
500.0000 mL | INTRAVENOUS | Status: DC | PRN
  Administered 2019-01-01: 500 mL via INTRAVENOUS

## 2019-01-01 MED ORDER — LIDOCAINE HCL (PF) 1 % IJ SOLN
INTRAMUSCULAR | Status: DC | PRN
  Administered 2019-01-01 (×2): 6 mL via EPIDURAL

## 2019-01-01 MED ORDER — LACTATED RINGERS IV SOLN
500.0000 mL | Freq: Once | INTRAVENOUS | Status: AC
  Administered 2019-01-01: 500 mL via INTRAVENOUS

## 2019-01-01 MED ORDER — ONDANSETRON HCL 4 MG/2ML IJ SOLN: 4.0000 mg | Freq: Four times a day (QID) | INTRAMUSCULAR | Status: DC | PRN

## 2019-01-01 MED ORDER — LACTATED RINGERS IV SOLN
INTRAVENOUS | Status: DC
  Administered 2019-01-01 (×3): via INTRAVENOUS

## 2019-01-01 MED ORDER — OXYTOCIN BOLUS FROM INFUSION: 500.0000 mL | Freq: Once | INTRAVENOUS | Status: DC

## 2019-01-01 MED ORDER — OXYTOCIN 40 UNITS IN NORMAL SALINE INFUSION - SIMPLE MED: 2.5000 [IU]/h | INTRAVENOUS | Status: DC

## 2019-01-01 MED ORDER — SODIUM CHLORIDE (PF) 0.9 % IJ SOLN
INTRAMUSCULAR | Status: DC | PRN
  Administered 2019-01-01: 12 mL/h via EPIDURAL

## 2019-01-01 NOTE — Progress Notes (Signed)
Patient ID: Kara Riddle, female   DOB: 11-17-1990, 28 y.o.   MRN: [id number] Kara Riddle is a 28 y.o. G1P0 at [redacted]w[redacted]d by ultrasound admitted for scheduled IOL 2/2 gest HTN.  Here at MD request to initiate cervical balloon.   Subjective:  Patient comfortable in bed, mother at [facility] and supportive. No ctx, reports increased mucous dc last couple days. No LOF/VB.  Denies HA/RUQ pain, one episode emesis this am but thinks it was anxiety about upcoming induction. No nausea at present.   Objective: Vitals:   01/01/19 0759 01/01/19 0807  BP: (!) 147/101   Pulse: 96   Resp: 16   Temp: 98 F (36.7 C)   TempSrc: Oral   Weight:  118.8 kg  Height:  5\' 8"  (1.727 m)    No intake/output data recorded. No intake/output data recorded.   FHT:  FHR: 120 bpm, variability: moderate,  accelerations:  Present,  decelerations:  Absent UC:   occasional SVE:   Dilation: 2.5 Effacement (%): 50 Station: -1 Exam by:: d paul cnm CB placed with ease, 60 cc inflated LR, traction to leg. Patient tolerated well.   Labs:   Recent Labs    01/01/19 0813  WBC 6.8  HGB 13.2  HCT 39.6  PLT 153    Assessment / Plan: Induction of labor due to gestational hypertension,  progressing well on pitocin  Labor: cervical balloon and Pitocin low dose for ripening Preeclampsia:  no signs or symptoms of toxicity and full labs pending, BP mild range Fetal Wellbeing:  Category I Pain Control:  anesthesia/amnalgesia PRN I/D:  GBS neg Anticipated MOD:  NSVD   Report with above to Dr. Garwin Brothers.   Juliene Pina, CNM, MSN 01/01/2019, 8:39 AM

## 2019-01-01 NOTE — Progress Notes (Signed)
S: comfortable  O: epidural Pitocin 10 miu  Tracing: baseline 120 (+) accels. Good variability Ctx q 3- 4 mins  VE 4/60-70/-2 OP asynclitic AROM clear fluid IUPC/ISE placed  Subsequent FHR deceleration on attempt to turn pt to right  FHR decel down to 70'-80's. Mat position, d/c pitocin. decel x 6 ? Related to triplet ctx but return to baseline with good variability throughout Subsequent ctx q 3 mins (+) accels CBC    Component Value Date/Time   WBC 6.8 01/01/2019 0813   RBC 4.58 01/01/2019 0813   HGB 13.2 01/01/2019 0813   HCT 39.6 01/01/2019 0813   PLT 153 01/01/2019 0813   MCV 86.5 01/01/2019 0813   MCH 28.8 01/01/2019 0813   MCHC 33.3 01/01/2019 0813   RDW 13.7 01/01/2019 0813   LYMPHSABS 1.2 12/02/2018 1314   MONOABS 0.6 12/02/2018 1314   EOSABS 0.2 12/02/2018 1314   BASOSABS 0.0 12/02/2018 1314   CMP Latest Ref Rng & Units 01/01/2019 12/02/2018  Glucose 70 - 99 mg/dL 97 90  BUN 6 - 20 mg/dL 8 6  Creatinine 0.44 - 1.00 mg/dL 0.62 0.54  Sodium 135 - 145 mmol/L 136 137  Potassium 3.5 - 5.1 mmol/L 3.8 3.7  Chloride 98 - 111 mmol/L 107 109  CO2 22 - 32 mmol/L 17(L) 19(L)  Calcium 8.9 - 10.3 mg/dL 8.8(L) 8.2(L)  Total Protein 6.5 - 8.1 g/dL 6.6 6.4(L)  Total Bilirubin 0.3 - 1.2 mg/dL 0.6 0.3  Alkaline Phos 38 - 126 U/L 110 83  AST 15 - 41 U/L 20 20  ALT 0 - 44 U/L 9 12     IMP: postdates Gest HTN Latent phase P) resume pitocin after 10 mins. Advised pt and mother of initial poss C/s with now back to reassuring tracing. Cont on course provided baby cont to do as well as it is now

## 2019-01-01 NOTE — H&P (Signed)
Kara Riddle is a 28 y.o. female presenting for iol @ 52 OB History    Gravida  1   Para      Term      Preterm      AB      Living        SAB      TAB      Ectopic      Multiple      Live Births             Past Medical History:  Diagnosis Date  . ADD (attention deficit disorder)   . Asthma   . HSV-1 (herpes simplex virus 1) infection    History reviewed. No pertinent surgical history. Family History: family history includes Allergies in her father; Asthma in her father; Pancreatic cancer in her maternal grandmother. Social History:  reports that she has never smoked. She has never used smokeless tobacco. She reports current alcohol use. She reports that she does not use drugs.     Maternal Diabetes: No Genetic Screening: Normal Maternal Ultrasounds/Referrals: Normal Fetal Ultrasounds or other Referrals:  None Maternal Substance Abuse:  No Significant Maternal Medications:  None Significant Maternal Lab Results:  Group B Strep negative Other Comments:  PREVIOUS CARE IN [place]  Review of Systems  All other systems reviewed and are negative.  History Dilation: 4 Effacement (%): 70 Station: -1 Exam by:: stone rnc Blood pressure 140/69, pulse 78, temperature 98.2 F (36.8 C), temperature source Oral, resp. rate 17, height 5\' 8"  (1.727 m), weight 118.8 kg, last menstrual period 03/23/2018. Exam Physical Exam  Constitutional: She is oriented to person, place, and time. She appears well-developed and well-nourished.  HENT:  Head: Atraumatic.  Eyes: EOM are normal.  Neck: Neck supple.  Cardiovascular: Regular rhythm.  Respiratory: Breath sounds normal.  GI: Soft.  Musculoskeletal:        General: Edema present.  Neurological: She is alert and oriented to person, place, and time.  Skin: Skin is warm and dry.  Psychiatric: She has a normal mood and affect.    Prenatal labs: ABO, Rh: --/--/O POS, O POS (07/29 0813) Antibody: NEG (07/29  0813) Rubella: Immune (06/22 0000) RPR: Nonreactive (06/22 0000)  HBsAg: Negative (06/22 0000)  HIV: Non-reactive (06/22 0000)  GBS: Negative (06/22 0000)   Assessment/Plan: Postdates Gest  HTN P) admit  [place] labs. IV pitocin. Intracervical balloon  Shayla Heming A Harlea Goetzinger 01/01/2019, 1:47 PM

## 2019-01-01 NOTE — Anesthesia Preprocedure Evaluation (Signed)
Anesthesia Evaluation  Patient identified by MRN, date of birth, ID band Patient awake    Reviewed: Allergy & Precautions, H&P , NPO status , Patient's Chart, lab work & pertinent test results  Airway Mallampati: II  TM Distance: >3 FB Neck ROM: full    Dental no notable dental hx. (+) Teeth Intact   Pulmonary neg pulmonary ROS,    Pulmonary exam normal breath sounds clear to auscultation       Cardiovascular negative cardio ROS Normal cardiovascular exam Rhythm:regular Rate:Normal     Neuro/Psych negative neurological ROS     GI/Hepatic negative GI ROS, Neg liver ROS,   Endo/Other  Morbid obesity  Renal/GU negative Renal ROS     Musculoskeletal   Abdominal (+) + obese,   Peds  Hematology negative hematology ROS (+)   Anesthesia Other Findings   Reproductive/Obstetrics (+) Pregnancy                             Anesthesia Physical Anesthesia Plan  ASA: III  Anesthesia Plan: Epidural   Post-op Pain Management:    Induction:   PONV Risk Score and Plan:   Airway Management Planned:   Additional Equipment:   Intra-op Plan:   Post-operative Plan:   Informed Consent: I have reviewed the patients History and Physical, chart, labs and discussed the procedure including the risks, benefits and alternatives for the proposed anesthesia with the patient or authorized representative who has indicated his/her understanding and acceptance.       Plan Discussed with:   Anesthesia Plan Comments:         Anesthesia Quick Evaluation

## 2019-01-01 NOTE — Anesthesia Procedure Notes (Signed)
Epidural Patient location during procedure: OB Start time: 01/01/2019 10:22 AM End time: 01/01/2019 10:26 AM  Staffing Anesthesiologist: Lyn Hollingshead, MD Performed: anesthesiologist   Preanesthetic Checklist Completed: patient identified, site marked, surgical consent, pre-op evaluation, timeout performed, IV checked, risks and benefits discussed and monitors and equipment checked  Epidural Patient position: sitting Prep: site prepped and draped and DuraPrep Patient monitoring: continuous pulse ox and blood pressure Approach: midline Location: L3-L4 Injection technique: LOR air  Needle:  Needle type: Tuohy  Needle gauge: 17 G Needle length: 9 cm and 9 Needle insertion depth: 5 cm cm Catheter type: closed end flexible Catheter size: 19 Gauge Catheter at skin depth: 10 cm Test dose: negative and Other  Assessment Events: blood not aspirated, injection not painful, no injection resistance, negative IV test and no paresthesia  Additional Notes Reason for block:procedure for pain

## 2019-01-01 NOTE — Consult Note (Signed)
Neonatology Note:   Attendance at Delivery:    I was asked by Dr. Cousins to attend this vacuum-assisted vaginal delivery at term. The mother is a G1P0 O pos, GBS neg with IOL for post dates, history of HSV-1, and ADD. ROM 5 hours before delivery, fluid initially clear, but with thin meconium at delivery. Infant a little slow to cry at first, so delayed cord clamping was not done, but then he began to be vigorous with good spontaneous cry and tone prior to 1 minute. Needed only minimal bulb suctioning. Ap 9/9. Lungs clear to ausc in DR. Infant is able to remain with his mother for skin to skin time under nursing supervision. Transferred to the care of Pediatrician.   Kara Willow C. Amory Simonetti, MD 

## 2019-01-02 DIAGNOSIS — Z8759 Personal history of other complications of pregnancy, childbirth and the puerperium: Secondary | ICD-10-CM

## 2019-01-02 LAB — CBC
HCT: 34.9 % — ABNORMAL LOW (ref 36.0–46.0)
HCT: 36.1 % (ref 36.0–46.0)
Hemoglobin: 11.4 g/dL — ABNORMAL LOW (ref 12.0–15.0)
Hemoglobin: 11.9 g/dL — ABNORMAL LOW (ref 12.0–15.0)
MCH: 28.6 pg (ref 26.0–34.0)
MCH: 28.8 pg (ref 26.0–34.0)
MCHC: 32.7 g/dL (ref 30.0–36.0)
MCHC: 33 g/dL (ref 30.0–36.0)
MCV: 87.4 fL (ref 80.0–100.0)
MCV: 87.5 fL (ref 80.0–100.0)
Platelets: 125 10*3/uL — ABNORMAL LOW (ref 150–400)
Platelets: 126 10*3/uL — ABNORMAL LOW (ref 150–400)
RBC: 3.99 MIL/uL (ref 3.87–5.11)
RBC: 4.13 MIL/uL (ref 3.87–5.11)
RDW: 13.6 % (ref 11.5–15.5)
RDW: 13.7 % (ref 11.5–15.5)
WBC: 11.9 10*3/uL — ABNORMAL HIGH (ref 4.0–10.5)
WBC: 13.7 10*3/uL — ABNORMAL HIGH (ref 4.0–10.5)
nRBC: 0 % (ref 0.0–0.2)
nRBC: 0 % (ref 0.0–0.2)

## 2019-01-02 LAB — COMPREHENSIVE METABOLIC PANEL
ALT: 8 U/L (ref 0–44)
AST: 20 U/L (ref 15–41)
Albumin: 2.5 g/dL — ABNORMAL LOW (ref 3.5–5.0)
Alkaline Phosphatase: 103 U/L (ref 38–126)
Anion gap: 9 (ref 5–15)
BUN: 5 mg/dL — ABNORMAL LOW (ref 6–20)
CO2: 19 mmol/L — ABNORMAL LOW (ref 22–32)
Calcium: 8.4 mg/dL — ABNORMAL LOW (ref 8.9–10.3)
Chloride: 110 mmol/L (ref 98–111)
Creatinine, Ser: 0.62 mg/dL (ref 0.44–1.00)
GFR calc Af Amer: >60 mL/min (ref >60)
GFR calc non Af Amer: >60 mL/min (ref >60)
Glucose, Bld: 88 mg/dL (ref 70–99)
Potassium: 3.7 mmol/L (ref 3.5–5.1)
Sodium: 138 mmol/L (ref 135–145)
Total Bilirubin: 0.6 mg/dL (ref 0.3–1.2)
Total Protein: 5.6 g/dL — ABNORMAL LOW (ref 6.5–8.1)

## 2019-01-02 LAB — URIC ACID: Uric Acid, Serum: 5 mg/dL (ref 2.5–7.1)

## 2019-01-02 MED ORDER — PRENATAL MULTIVITAMIN CH
1.0000 | ORAL_TABLET | Freq: Every day | ORAL | Status: DC
  Administered 2019-01-02: 13:00:00 1 via ORAL
  Filled 2019-01-02: qty 1

## 2019-01-02 MED ORDER — WITCH HAZEL-GLYCERIN EX PADS: 1.0000 "application " | MEDICATED_PAD | CUTANEOUS | Status: DC | PRN

## 2019-01-02 MED ORDER — COCONUT OIL OIL: 1.0000 "application " | TOPICAL_OIL | Status: DC | PRN

## 2019-01-02 MED ORDER — OXYCODONE HCL 5 MG PO TABS: 10.0000 mg | ORAL_TABLET | ORAL | Status: DC | PRN

## 2019-01-02 MED ORDER — ONDANSETRON HCL 4 MG PO TABS: 4.0000 mg | ORAL_TABLET | ORAL | Status: DC | PRN

## 2019-01-02 MED ORDER — DIPHENHYDRAMINE HCL 25 MG PO CAPS: 25.0000 mg | ORAL_CAPSULE | Freq: Four times a day (QID) | ORAL | Status: DC | PRN

## 2019-01-02 MED ORDER — ONDANSETRON HCL 4 MG/2ML IJ SOLN: 4.0000 mg | INTRAMUSCULAR | Status: DC | PRN

## 2019-01-02 MED ORDER — DIBUCAINE (PERIANAL) 1 % EX OINT: 1.0000 "application " | TOPICAL_OINTMENT | CUTANEOUS | Status: DC | PRN

## 2019-01-02 MED ORDER — ZOLPIDEM TARTRATE 5 MG PO TABS: 5.0000 mg | ORAL_TABLET | Freq: Every evening | ORAL | Status: DC | PRN

## 2019-01-02 MED ORDER — ACETAMINOPHEN 325 MG PO TABS
650.0000 mg | ORAL_TABLET | ORAL | Status: DC | PRN
  Administered 2019-01-02 – 2019-01-03 (×2): 650 mg via ORAL
  Filled 2019-01-02 (×2): qty 2

## 2019-01-02 MED ORDER — BENZOCAINE-MENTHOL 20-0.5 % EX AERO
1.0000 "application " | INHALATION_SPRAY | CUTANEOUS | Status: DC | PRN
  Administered 2019-01-02: 1 via TOPICAL
  Filled 2019-01-02: qty 56

## 2019-01-02 MED ORDER — FERROUS SULFATE 325 (65 FE) MG PO TABS
325.0000 mg | ORAL_TABLET | Freq: Two times a day (BID) | ORAL | Status: DC
  Administered 2019-01-02 – 2019-01-03 (×3): 325 mg via ORAL
  Filled 2019-01-02 (×3): qty 1

## 2019-01-02 MED ORDER — ALBUTEROL SULFATE (2.5 MG/3ML) 0.083% IN NEBU: 3.0000 mL | INHALATION_SOLUTION | RESPIRATORY_TRACT | Status: DC | PRN

## 2019-01-02 MED ORDER — SIMETHICONE 80 MG PO CHEW: 80.0000 mg | CHEWABLE_TABLET | ORAL | Status: DC | PRN

## 2019-01-02 MED ORDER — OXYCODONE HCL 5 MG PO TABS
5.0000 mg | ORAL_TABLET | ORAL | Status: DC | PRN
  Administered 2019-01-02 – 2019-01-03 (×3): 5 mg via ORAL
  Filled 2019-01-02 (×3): qty 1

## 2019-01-02 MED ORDER — SENNOSIDES-DOCUSATE SODIUM 8.6-50 MG PO TABS
2.0000 | ORAL_TABLET | ORAL | Status: DC
  Administered 2019-01-02 – 2019-01-03 (×2): 2 via ORAL
  Filled 2019-01-02 (×3): qty 2

## 2019-01-02 MED ORDER — IBUPROFEN 600 MG PO TABS
600.0000 mg | ORAL_TABLET | Freq: Four times a day (QID) | ORAL | Status: DC
  Administered 2019-01-02 – 2019-01-03 (×6): 600 mg via ORAL
  Filled 2019-01-02 (×6): qty 1

## 2019-01-02 NOTE — Progress Notes (Signed)
Patient ID: Kara Riddle, female   DOB: 1990/09/23, 28 y.o.   MRN: [id number] PPD # 1 S/P VAVD  Live born female  Birth Weight: 8 lb 5.9 oz (3796 g) APGAR: 9, 9  Newborn Delivery   Birth date/time: 01/01/2019 22:18:00 Delivery type: Vaginal, Vacuum (Extractor)     Baby name: Angela Adam Delivering provider: COUSINS, SHERONETTE  Episiotomy:None   Lacerations:Sulcus;Labial;Vaginal   circumcision planned  Feeding: bottle  Pain control at delivery: Epidural   S:  Reports feeling tired but happy with vaginal birth.             Tolerating po/ No nausea or vomiting             Bleeding is moderate             Pain controlled with ibuprofen (OTC)             Up ad lib / ambulatory / voiding without difficulties   O:  A & O x 3, in no apparent distress              VS:  Vitals:   01/02/19 0030 01/02/19 0130 01/02/19 0611 01/02/19 0941  BP: 126/77 (!) 145/69 125/67 133/78  Pulse: 86 92 83 89  Resp: _0 Temp: 98.5 F (36.9 C) 98.7 F (37.1 C) 97.9 F (36.6 C) 97.8 F (36.6 C)  TempSrc: Oral Oral Oral Oral  SpO2: 99%     Weight:      Height:        LABS:  Recent Labs    01/02/19 0011 01/02/19 0555  WBC 13.7* 11.9*  HGB 11.9* 11.4*  HCT 36.1 34.9*  PLT 126* 125*   Hepatic Function Latest Ref Rng & Units 01/02/2019 01/01/2019 12/02/2018  Total Protein 6.5 - 8.1 g/dL 5.6(L) 6.6 6.4(L)  Albumin 3.5 - 5.0 g/dL 2.5(L) 3.0(L) 2.9(L)  AST 15 - 41 U/L _1 ALT 0 - 44 U/L _2 Alk Phosphatase 38 - 126 U/L 103 110 83  Total Bilirubin 0.3 - 1.2 mg/dL 0.6 0.6 0.3    Blood type: --/--/O POS, O POS (07/29 0813)  Rubella: Immune (06/22 0000)   I&O: I/O last 3 completed shifts: In: 2404.6 [I.V.:2404.6] Out: 3663 [Urine:3100; Blood:563]          No intake/output data recorded.   Lungs: Clear and unlabored  Heart: regular rate and rhythm / no murmurs  Abdomen: soft, non-tender, non-distended             Fundus: firm, non-tender, U-1  Perineum: deferred  Lochia:  small  Extremities: +1 pedal edema, no calf pain or tenderness    A/P: PPD # 1 28 y.o., G1P0   Principal Problem:   Postpartum care following VAVD 7/29 Active Problems:   Encounter for planned induction of labor   Obstetrical laceration -  Right  Vaginal Sulcus;Labial right minora   Status post vacuum-assisted vaginal delivery Gestational hypertension - BP mostly normal, mild range outolier - labs grossly wnl, mild thrombocytopenis - no neural PEC sx  Doing well - stable status  Routine post partum orders  Anticipate discharge tomorrow    Juliene Pina, MSN, CNM 01/02/2019, 9:47 AM

## 2019-01-02 NOTE — Lactation Note (Signed)
This note was copied from a baby's chart. Lactation Consultation Note  Patient Name: Kara Riddle [id number] Date: 01/02/2019  P1, 5 hour female infant. Per mom, infant did not latch in L&D. Her feeding preference at admission was breast and bottle. Mom wants [facility] services in the morning she is very tired and prefers to formula feed infant tonight she doesn't want to latch infant to breast at this time.  Per mom, her breast did get larger and darker in pregnancy. LC discussed hand expression and colostrum is not present at this time mom is not interested in pumping.    Maternal Data    Feeding Feeding Type: Formula  LATCH Score                   Interventions    Lactation Tools Discussed/Used     Consult Status      Vicente Serene 01/02/2019, 4:08 AM

## 2019-01-03 MED ORDER — SUCROSE 24% NICU/PEDS ORAL SOLUTION: 0.5000 mL | OROMUCOSAL | Status: DC | PRN

## 2019-01-03 MED ORDER — SENNOSIDES-DOCUSATE SODIUM 8.6-50 MG PO TABS: 2.0000 | ORAL_TABLET | Freq: Every evening | ORAL | 0 refills | Status: DC | PRN

## 2019-01-03 MED ORDER — LIDOCAINE 1% INJECTION FOR CIRCUMCISION
0.8000 mL | INJECTION | Freq: Once | INTRAVENOUS | Status: DC
  Filled 2019-01-03: qty 1

## 2019-01-03 MED ORDER — ACETAMINOPHEN FOR CIRCUMCISION 160 MG/5 ML: 40.0000 mg | ORAL | Status: DC | PRN

## 2019-01-03 MED ORDER — WHITE PETROLATUM EX OINT: 1.0000 "application " | TOPICAL_OINTMENT | CUTANEOUS | Status: DC | PRN

## 2019-01-03 MED ORDER — IBUPROFEN 600 MG PO TABS: 600.0000 mg | ORAL_TABLET | Freq: Four times a day (QID) | ORAL | 0 refills | Status: DC

## 2019-01-03 MED ORDER — OXYCODONE HCL 5 MG PO TABS: 5.0000 mg | ORAL_TABLET | ORAL | 0 refills | Status: DC | PRN

## 2019-01-03 MED ORDER — ACETAMINOPHEN FOR CIRCUMCISION 160 MG/5 ML: 40.0000 mg | Freq: Once | ORAL | Status: DC

## 2019-01-03 MED ORDER — EPINEPHRINE TOPICAL FOR CIRCUMCISION 0.1 MG/ML: 1.0000 [drp] | TOPICAL | Status: DC | PRN

## 2019-01-03 NOTE — Discharge Summary (Addendum)
Obstetric Discharge Summary   Patient Name: Kara Riddle DOB: 03-Sep-1990 MRN: [id number]  Date of Admission: 01/01/2019 Date of Discharge: 01/03/2019 Date of Delivery: 01/01/2019 Gestational Age at Delivery: [redacted]w[redacted]d  Primary OB: [facility] - Dr. Garwin Brothers   Antepartum complications:  - Late transfer of care at 62 weeks; good PNC in [place] - Gestational hypertension  - Asthma  - Former smoker - Hx. Of narcotic dependence in remission (2018) Prenatal Labs:  ABO, Rh: --/--/O POS, O POS (07/29 0813) Antibody: NEG (07/29 0813) Rubella: Immune (06/22 0000) RPR: Nonreactive (06/22 0000)  HBsAg: Negative (06/22 0000)  HIV: Non-reactive (06/22 0000)  GBS: Negative (06/22 0000)   Admitting Diagnosis: IOL at 40 4/7 weeks postdates  Secondary Diagnoses: gestational hypertension Patient Active Problem List   Diagnosis Date Noted  . Postpartum care following VAVD 7/29 01/02/2019  . Obstetrical laceration -  Right  Vaginal Sulcus;Labial right minora 01/02/2019  . Status post vacuum-assisted vaginal delivery 01/02/2019  . Encounter for planned induction of labor 01/01/2019  . Asthma, moderate persistent 06/03/2014  . Seasonal and perennial allergic rhinitis 02/24/2014  . Attention deficit disorder 01/16/2011   Induction: intracervical balloon , pitocin Augmentation: , AROM  Date of Delivery: 01/01/2019 Delivered By: Dr. Garwin Brothers Delivery Type: VAVD Anesthesia: epidural Placenta: spontaneous Laceration:  Right Vaginal Sulcus;Labial right minora Episiotomy: none  Newborn Data: Live born female  Birth Weight: 8 lb 5.9 oz (3796 g) APGAR: 9, 9  Newborn Delivery   Birth date/time: 01/01/2019 22:18:00 Delivery type: Vaginal, Vacuum (Extractor)      Hospital/Postpartum Course  (Vaginal Delivery): Pt. Admitted at 40 weeks for induction of labor for  Postdates. Pt was found to have gestational hypertension on admission with nl PIH labs.  She progressed with intracervical balloon,  Pitocin, and AROM.  She was delivered by vacuum assistance due to repetitive deep variable decels not responsive to amnioinfusion. Patient had an uncomplicated postpartum course.  By time of discharge on PPD#2 , her pain was controlled on oral pain medications; she had appropriate lochia and was ambulating, voiding without difficulty and tolerating regular diet.  She was deemed stable for discharge to home.    Labs: CBC Latest Ref Rng & Units 01/02/2019 01/02/2019 01/01/2019  WBC 4.0 - 10.5 K/uL 11.9(H) 13.7(H) 6.8  Hemoglobin 12.0 - 15.0 g/dL 11.4(L) 11.9(L) 13.2  Hematocrit 36.0 - 46.0 % 34.9(L) 36.1 39.6  Platelets 150 - 400 K/uL 125(L) 126(L) 153   O POS  Physical exam:  BP 130/72 (BP Location: Right Arm)   Pulse 87   Temp 98 F (36.7 C) (Oral)   Resp 18   Ht 5\' 8"  (1.727 m)   Wt 118.8 kg   LMP 03/23/2018   SpO2 98%   BMI 39.84 kg/m  Alert and oriented X3             Lungs: Clear and unlabored             Heart: regular rate and rhythm / no murmurs             Abdomen: soft, non-tender, non-distended              Fundus: firm, non-tender, U-2             Perineum: intact; mild edema, no ecchymosis, no erythema             Lochia: scant rubra on pad              Extremities: +1 LE  edema, no calf pain or tenderness  Disposition: stable, discharge to home Baby Feeding: formula Baby Disposition: home with mom  Rh Immune globulin given: N/A Rubella vaccine given: N/A Tdap vaccine given in AP or PP setting: UTD Flu vaccine given in AP or PP setting: not on file    Plan:  Kara Riddle was discharged to home in good condition. [facility] 1 week for BP check.  Discharge Instructions: Per After Visit Summary. Refer to After Visit Summary and [facility]  Activity: Advance as tolerated. Pelvic rest for 6 weeks.   Diet: Regular, Heart Healthy Discharge Medications: Allergies as of 01/03/2019   No Known Allergies      Medication List    STOP taking these medications   valACYclovir 500 MG tablet Commonly known as: VALTREX     TAKE these medications   Advair HFA 230-21 MCG/ACT inhaler Generic drug: fluticasone-salmeterol INHALE 2 PUFFS INTO THE LUNGS 2 TIMES DAILY, RINSE MOUTH AFTER USE What changed: See the new instructions.   ibuprofen 600 MG tablet Commonly known as: ADVIL Take 1 tablet (600 mg total) by mouth every 6 (six) hours.   montelukast 10 MG tablet Commonly known as: SINGULAIR TAKE 1 TABLET BY MOUTH AT BEDTIME   oxyCODONE 5 MG immediate release tablet Commonly known as: Oxy IR/ROXICODONE Take 1 tablet (5 mg total) by mouth every 4 (four) hours as needed (pain scale 4-7).   prenatal multivitamin Tabs tablet Take 1 tablet by mouth daily at 12 noon.   ProAir HFA 108 (90 Base) MCG/ACT inhaler Generic drug: albuterol INHALE 2 PUFFS 4 TIMES DAILY FOR 2-4 WEEKS What changed:   See the new instructions.  Another medication with the same name was removed. Continue taking this medication, and follow the directions you see here.   senna-docusate 8.6-50 MG tablet Commonly known as: Senokot-S Take 2 tablets by mouth at bedtime as needed for mild constipation.            Discharge Care Instructions  (From admission, onward)         Start     Ordered   01/03/19 0000  Discharge wound care:    Comments: Alternate between ice packs and warm water sitz baths 3 times daily as needed   01/03/19 1225         Outpatient follow up:  Follow-up Information    Maxie Betterousins, Ayme Short, MD. Schedule an appointment as soon as possible for a visit in 1 week(s).   Specialty: Obstetrics and Gynecology Why: Call with blood pressure check Contact information: [address] [place] [facility] [place] [id number] [phone]           Signed:  Carlean JewsMeredith Sigmon, MSN, CNM [facility]

## 2019-01-03 NOTE — Anesthesia Postprocedure Evaluation (Signed)
Anesthesia Post Note  Patient: Kara Riddle  Procedure(s) Performed: AN AD HOC LABOR EPIDURAL     Patient location during evaluation: Mother Baby Anesthesia Type: Epidural Level of consciousness: awake and alert Pain management: pain level controlled Vital Signs Assessment: post-procedure vital signs reviewed and stable Respiratory status: spontaneous breathing, nonlabored ventilation and respiratory function stable Cardiovascular status: stable Postop Assessment: no headache, no backache and epidural receding Anesthetic complications: no    Last Vitals:  Vitals:   01/02/19 2320 01/03/19 0606  BP: 130/77 130/72  Pulse: 88 87  Resp: 16 18  Temp: 36.8 C 36.7 C  SpO2: 99% 98%    Last Pain:  Vitals:   01/03/19 0606  TempSrc: Oral  PainSc:    Pain Goal: Patients Stated Pain Goal: 4 (01/02/19 1923)                 Rayvon Char

## 2019-01-03 NOTE — Plan of Care (Signed)
Good for DC. 

## 2019-01-03 NOTE — Progress Notes (Signed)
PPD #2, VAVD, Sulcus;Labial;Vaginal, baby boy "Joanne GavelSutton"  S:  Reports feeling good, but sore. Ready to be discharged home today.  Reports some pain from repair requiring Oxycodone PRN with Motrin and Tylenol.  Hx. Of narcotic dependence and was on Suboxone in 2018.  She reports she has been off Suboxone, and is staying with her parents, so her mom will dispense her medications as needed.  She feels comfortable taking Oxycodone for a short time due to perineal pain.              Tolerating po/ No nausea or vomiting / Denies dizziness or SOB             Bleeding is light             Up ad lib / ambulatory / voiding QS without difficulty  Newborn formula feeding  / Circumcision - completed   O:               VS: BP 130/72 (BP Location: Right Arm)   Pulse 87   Temp 98 F (36.7 C) (Oral)   Resp 18   Ht 5\' 8"  (1.727 m)   Wt 118.8 kg   LMP 03/23/2018   SpO2 98%   BMI 39.84 kg/m  01/03/19 0606  98 F (36.7 C)  87  -  18  130/72  Lying  98 %  -  - JM   01/02/19 23:20:30  98.3 F (36.8 C)  88  -  16  130/77  Lying  99 %  -  - MW   01/02/19 0941  97.8 F (36.6 C)  89  -  17  133/78  -  -  -  - AP   01/02/19 0611  97.9 F (36.6 C)  83  -  19  125/67  Lying  -  -  - JM   01/02/19 0130  98.7 F (37.1 C)  92  -  18  145/69Abnormal   Semi-fowlers  -  -  - JM   01/02/19 0030  98.5 F (36.9 C)  86  -  18  126/77  Semi-fowlers             LABS:              Recent Labs    01/02/19 0011 01/02/19 0555  WBC 13.7* 11.9*  HGB 11.9* 11.4*  PLT 126* 125*               Blood type: --/--/O POS, O POS (07/29 0813)  Rubella: Immune (06/22 0000)                     I&O: Intake/Output      07/30 0701 - 07/31 0700 07/31 0701 - 08/01 0700   I.V. (mL/kg)     Total Intake(mL/kg)     Urine (mL/kg/hr)     Blood     Total Output     Net                        Physical Exam:             Alert and oriented X3  Lungs: Clear and unlabored  Heart: regular rate and rhythm / no murmurs  Abdomen: soft,  non-tender, non-distended              Fundus: firm, non-tender, U-2  Perineum: intact; mild edema, no ecchymosis, no erythema  Lochia:  scant rubra on pad   Extremities: +1 LE edema, no calf pain or tenderness    A/P:  PPD#2, VAVD   Obstetrical laceration -  Right  Vaginal Sulcus;Labial right minora  Gestational hypertension   - BP mostly normal, mild range outolier   - labs grossly wnl, mild thrombocytopenia   - no neural PEC sx  Hx. Of narcotic dependence   - Warning s/s reviewed              Doing well - stable status             Routine post partum orders             Discharge home today  [facility] discharge book given, instructions and warning s/s reviewed  F/u in 1 week for BP check - pt. Reports her dad has a cuff that she can use and call in reading  Lars Pinks, MSN, [facility]

## 2019-03-22 ENCOUNTER — Encounter (HOSPITAL_COMMUNITY): Payer: Self-pay

## 2019-04-07 ENCOUNTER — Telehealth: Payer: Self-pay | Admitting: Internal Medicine

## 2019-04-07 NOTE — Telephone Encounter (Signed)
Left message for patient to call back. Patient has not been seen in over 4 years. She will need to be scheduled as a new patient for recommendations.

## 2019-04-08 NOTE — Telephone Encounter (Signed)
Spoke with patient. She has been scheduled as a consult for CY on 04/10/19 at 930am. Patient is aware of the new office location. Nothing further needed at time of call.

## 2019-04-08 NOTE — Telephone Encounter (Signed)
I called pt and advised her that she needed an office visit to be seen for recommendations. She agreed and wanted to be seen by CY. Can we place her in a 30 min slot for a new patient appointment with CY? Please advise.

## 2019-04-08 NOTE — Telephone Encounter (Signed)
Yes- next available with me. Ok to use held spot.

## 2019-04-10 ENCOUNTER — Encounter: Payer: Self-pay | Admitting: Internal Medicine

## 2019-04-10 ENCOUNTER — Other Ambulatory Visit: Payer: Self-pay

## 2019-04-10 ENCOUNTER — Ambulatory Visit (INDEPENDENT_AMBULATORY_CARE_PROVIDER_SITE_OTHER): Payer: BC Managed Care – PPO | Admitting: Internal Medicine

## 2019-04-10 VITALS — BP 128/70 | HR 85 | Temp 98.0°F | Ht 68.0 in | Wt 237.0 lb

## 2019-04-10 DIAGNOSIS — J302 Other seasonal allergic rhinitis: Secondary | ICD-10-CM

## 2019-04-10 DIAGNOSIS — Z23 Encounter for immunization: Secondary | ICD-10-CM | POA: Diagnosis not present

## 2019-04-10 DIAGNOSIS — J3089 Other allergic rhinitis: Secondary | ICD-10-CM | POA: Diagnosis not present

## 2019-04-10 DIAGNOSIS — J4541 Moderate persistent asthma with (acute) exacerbation: Secondary | ICD-10-CM

## 2019-04-10 MED ORDER — MONTELUKAST SODIUM 10 MG PO TABS: 10.0000 mg | ORAL_TABLET | Freq: Every day | ORAL | 5 refills | Status: DC

## 2019-04-10 MED ORDER — BUDESONIDE-FORMOTEROL FUMARATE 160-4.5 MCG/ACT IN AERO: 2.0000 | INHALATION_SPRAY | Freq: Two times a day (BID) | RESPIRATORY_TRACT | 5 refills | Status: DC

## 2019-04-10 MED ORDER — METHYLPREDNISOLONE ACETATE 80 MG/ML IJ SUSP
80.0000 mg | Freq: Once | INTRAMUSCULAR | Status: AC
  Administered 2019-04-10: 11:00:00 80 mg via INTRAMUSCULAR

## 2019-04-10 NOTE — Patient Instructions (Signed)
Order- Depo 100     Dx asthma moderate persistent exacerbation  Order- flu vax standard  Script sent for Singulair    1 tab daily for asthma control  Script sent for Symbicort 160    Inhale 2 puffs then rinse mouth, twice daily    Try this instead of your Advair inhaler  Please call as needed

## 2019-04-10 NOTE — Assessment & Plan Note (Signed)
Baseline unclear since was doing worse during pregnancy, using Vapes, and out of Advair hfa which worked better than disk. We will see how hard it is to get back under control. Plan- educated on common triggers, basic meds. Change Advair hfa to Symbicort 160, add Singulair, depomedrol today.
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
## 2019-04-10 NOTE — Assessment & Plan Note (Signed)
This was more of an issue when she was younger. Minimizes now.  Watch for seasonal significance.

## 2019-04-13 ENCOUNTER — Other Ambulatory Visit: Payer: Self-pay

## 2019-04-13 ENCOUNTER — Emergency Department (HOSPITAL_COMMUNITY)
Admission: EM | Admit: 2019-04-13 | Discharge: 2019-04-14 | Disposition: A | Discharge status: Home / Self Care | Payer: BC Managed Care – PPO | Attending: Emergency Medicine | Admitting: Emergency Medicine

## 2019-04-13 ENCOUNTER — Encounter (HOSPITAL_COMMUNITY): Payer: Self-pay | Admitting: Emergency Medicine

## 2019-04-13 DIAGNOSIS — F1490 Cocaine use, unspecified, uncomplicated: Secondary | ICD-10-CM | POA: Diagnosis not present

## 2019-04-13 DIAGNOSIS — T450X1A Poisoning by antiallergic and antiemetic drugs, accidental (unintentional), initial encounter: Secondary | ICD-10-CM

## 2019-04-13 LAB — COMPREHENSIVE METABOLIC PANEL
ALT: 26 U/L (ref 0–44)
AST: 65 U/L — ABNORMAL HIGH (ref 15–41)
Albumin: 3.9 g/dL (ref 3.5–5.0)
Alkaline Phosphatase: 58 U/L (ref 38–126)
Anion gap: 10 (ref 5–15)
BUN: 8 mg/dL (ref 6–20)
CO2: 22 mmol/L (ref 22–32)
Calcium: 9 mg/dL (ref 8.9–10.3)
Chloride: 103 mmol/L (ref 98–111)
Creatinine, Ser: 0.79 mg/dL (ref 0.44–1.00)
GFR calc Af Amer: >60 mL/min (ref >60)
GFR calc non Af Amer: >60 mL/min (ref >60)
Glucose, Bld: 116 mg/dL — ABNORMAL HIGH (ref 70–99)
Potassium: 3.4 mmol/L — ABNORMAL LOW (ref 3.5–5.1)
Sodium: 135 mmol/L (ref 135–145)
Total Bilirubin: 0.8 mg/dL (ref 0.3–1.2)
Total Protein: 7 g/dL (ref 6.5–8.1)

## 2019-04-13 LAB — CBC
HCT: 41.4 % (ref 36.0–46.0)
Hemoglobin: 13 g/dL (ref 12.0–15.0)
MCH: 29.3 pg (ref 26.0–34.0)
MCHC: 31.4 g/dL (ref 30.0–36.0)
MCV: 93.2 fL (ref 80.0–100.0)
Platelets: 209 10*3/uL (ref 150–400)
RBC: 4.44 MIL/uL (ref 3.87–5.11)
RDW: 13.2 % (ref 11.5–15.5)
WBC: 6.8 10*3/uL (ref 4.0–10.5)
nRBC: 0 % (ref 0.0–0.2)

## 2019-04-13 LAB — I-STAT BETA HCG BLOOD, ED (MC, WL, AP ONLY): I-stat hCG, quantitative: <5 m[IU]/mL (ref <5)

## 2019-04-13 LAB — RAPID URINE DRUG SCREEN, HOSP PERFORMED
Amphetamines: NOT DETECTED
Barbiturates: NOT DETECTED
Benzodiazepines: NOT DETECTED
Cocaine: POSITIVE — AB
Opiates: NOT DETECTED
Tetrahydrocannabinol: NOT DETECTED

## 2019-04-13 LAB — ACETAMINOPHEN LEVEL: Acetaminophen (Tylenol), Serum: <10 ug/mL — ABNORMAL LOW (ref 10–30)

## 2019-04-13 LAB — ETHANOL: Alcohol, Ethyl (B): <10 mg/dL (ref <10)

## 2019-04-13 LAB — SALICYLATE LEVEL: Salicylate Lvl: <7 mg/dL (ref 2.8–30.0)

## 2019-04-13 LAB — CBG MONITORING, ED: Glucose-Capillary: 120 mg/dL — ABNORMAL HIGH (ref 70–99)

## 2019-04-13 MED ORDER — LACTATED RINGERS IV BOLUS
1000.0000 mL | Freq: Once | INTRAVENOUS | Status: AC
  Administered 2019-04-13: 1000 mL via INTRAVENOUS

## 2019-04-13 MED ORDER — SODIUM CHLORIDE 0.9% FLUSH: 3.0000 mL | Freq: Once | INTRAVENOUS | Status: DC

## 2019-04-13 NOTE — ED Notes (Signed)
RN introduced self to pt.  Pt "flopping around in the bed" unable to sit still.  Pt was educated on the importance of keeping vital equipment on so that she can continued to be monitored.  Pt verbally acknowledged request.

## 2019-04-13 NOTE — ED Notes (Signed)
Pt nauseas, emesis when she was coughing a lot.  Linins and gown changed.  Pt continues to be unable to remain still.  Continues to remove monitoring equipment.

## 2019-04-13 NOTE — ED Triage Notes (Signed)
Significant other reports that patient took OD of benadryl and shot of ETOH. Patient unable to report what she ingested and unable to sit still in wheelchair. Picking at skin on hands and feet.

## 2019-04-13 NOTE — ED Provider Notes (Signed)
[place] EMERGENCY DEPARTMENT Provider Note   CSN: [id number] Arrival date & time: 04/13/19  1646     History   Chief Complaint Chief Complaint  Patient presents with  . Overdose    HPI Kara Riddle is a 28 y.o. female.      Ingestion This is a new problem. The current episode started 1 to 2 hours ago. The problem occurs constantly. The problem has been gradually improving. Pertinent negatives include no chest pain, no abdominal pain and no shortness of breath. Associated symptoms comments: Itching rash on her hands and feet which prompted her to take multiple tablets of Benadryl. Nothing aggravates the symptoms. Nothing relieves the symptoms. Treatments tried: Patient tried taking reportedly 6 tablets of Benadryl for itching rash however it did not improve and her mental status began to decline.  She became somnolent and agitated intermittently. The treatment provided no relief.     Past Medical History:  Diagnosis Date  . ADD (attention deficit disorder)   . Asthma   . HSV-1 (herpes simplex virus 1) infection     Patient Active Problem List   Diagnosis Date Noted  . Postpartum care following VAVD 7/29 01/02/2019  . Obstetrical laceration -  Right  Vaginal Sulcus;Labial right minora 01/02/2019  . Status post vacuum-assisted vaginal delivery 01/02/2019  . Encounter for planned induction of labor 01/01/2019  . Asthma, moderate persistent 06/03/2014  . Seasonal and perennial allergic rhinitis 02/24/2014  . Attention deficit disorder 01/16/2011    History reviewed. No pertinent surgical history.   OB History    Gravida  1   Para      Term      Preterm      AB      Living        SAB      TAB      Ectopic      Multiple      Live Births               Home Medications    Prior to Admission medications   Medication Sig Start Date End Date Taking? Authorizing Provider  budesonide-formoterol (SYMBICORT) 160-4.5 MCG/ACT  inhaler Inhale 2 puffs into the lungs 2 (two) times daily. 04/10/19  Yes Young, Tarri Fuller D, MD  montelukast (SINGULAIR) 10 MG tablet Take 1 tablet (10 mg total) by mouth at bedtime. 04/10/19  Yes Young, Kasandra Knudsen, MD  PROAIR HFA 108 [phone] Base) MCG/ACT inhaler INHALE 2 PUFFS 4 TIMES DAILY FOR 2-4 WEEKS Patient taking differently: Inhale 1-2 puffs into the lungs every 4 (four) hours as needed for shortness of breath.  12/30/15  Yes Young, Clinton D, MD  ADVAIR [facility] 230-21 MCG/ACT inhaler INHALE 2 PUFFS INTO THE LUNGS 2 TIMES DAILY, RINSE MOUTH AFTER USE Patient not taking: No sig reported 10/22/15   Baird Lyons D, MD  montelukast (SINGULAIR) 10 MG tablet TAKE 1 TABLET BY MOUTH AT BEDTIME Patient not taking: Reported on 04/13/2019 03/03/15   Deneise Lever, MD    Family History Family History  Problem Relation Age of Onset  . Asthma Father   . Allergies Father   . Pancreatic cancer Maternal Grandmother     Social History Social History   Tobacco Use  . Smoking status: Current Some Day Smoker    Types: E-cigarettes  . Smokeless tobacco: Never Used  Substance Use Topics  . Alcohol use: Yes    Comment: beer once a week on avg  .  Drug use: No     Allergies   Patient has no known allergies.   Review of Systems Review of Systems  Constitutional: Negative for chills and fever.  HENT: Negative for ear pain and sore throat.   Eyes: Negative for pain and visual disturbance.  Respiratory: Negative for cough and shortness of breath.   Cardiovascular: Negative for chest pain and palpitations.  Gastrointestinal: Negative for abdominal pain and vomiting.  Genitourinary: Negative for dysuria and hematuria.  Musculoskeletal: Negative for arthralgias and back pain.  Skin: Positive for rash (Bilateral hands and feet with mildly erythematous pruritic rash since this morning). Negative for color change.  Neurological: Negative for seizures and syncope.  Psychiatric/Behavioral: Positive for  agitation. Negative for hallucinations, self-injury, sleep disturbance and suicidal ideas. The patient is not nervous/anxious.   All other systems reviewed and are negative.    Physical Exam Updated Vital Signs BP (!) 128/98   Pulse 65   Temp (!) 97.2 F (36.2 C) (Temporal)   Resp (!) 22   SpO2 97%   Physical Exam Vitals signs and nursing note reviewed.  Constitutional:      General: She is in acute distress.     Appearance: She is well-developed.     Comments: Patient is agitated and also intermittently somnolent.  She is alert and oriented x3  HENT:     Head: Normocephalic and atraumatic.  Eyes:     Conjunctiva/sclera: Conjunctivae normal.  Neck:     Musculoskeletal: Neck supple. No neck rigidity.  Cardiovascular:     Rate and Rhythm: Regular rhythm. Bradycardia present.     Heart sounds: No murmur.  Pulmonary:     Effort: Pulmonary effort is normal. No respiratory distress.     Breath sounds: Normal breath sounds.  Abdominal:     Palpations: Abdomen is soft.     Tenderness: There is no abdominal tenderness. There is no right CVA tenderness or left CVA tenderness.  Musculoskeletal:        General: No tenderness.     Comments: Mild redness noted of bilateral hands and feet.  Skin:    General: Skin is warm and dry.     Capillary Refill: Capillary refill takes less than 2 seconds.     Findings: Rash (Bilateral hands and feet rash noted.  Does not appear to be urticarial.  Most consistent with contact dermatitis) present.  Neurological:     Mental Status: She is alert and oriented to person, place, and time.     Sensory: No sensory deficit.  Psychiatric:     Comments: Patient and close contacts denies any suicidal ideation or threats.  Patient denies auditory visual hallucination.      ED Treatments / Results  Labs (all labs ordered are listed, but only abnormal results are displayed) Labs Reviewed  COMPREHENSIVE METABOLIC PANEL - Abnormal; Notable for the  following components:      Result Value   Potassium 3.4 (*)    Glucose, Bld 116 (*)    AST 65 (*)    All other components within normal limits  ACETAMINOPHEN LEVEL - Abnormal; Notable for the following components:   Acetaminophen (Tylenol), Serum <10 (*)    All other components within normal limits  RAPID URINE DRUG SCREEN, HOSP PERFORMED - Abnormal; Notable for the following components:   Cocaine POSITIVE (*)    All other components within normal limits  CBG MONITORING, ED - Abnormal; Notable for the following components:   Glucose-Capillary 120 (*)  All other components within normal limits  CBC  ETHANOL  SALICYLATE LEVEL  I-STAT BETA HCG BLOOD, ED ([facility], [facility], [facility] ONLY)    EKG EKG Interpretation  Date/Time:  Sunday April 13 2019 16:54:15 EST Ventricular Rate:  84 PR Interval:    QRS Duration: 116 QT Interval:  388 QTC Calculation: 459 R Axis:   81 Text Interpretation: Sinus rhythm Nonspecific intraventricular conduction delay No previous ECGs available Confirmed by Alvira MondaySchlossman, Erin ([id number]) on 04/13/2019 5:04:39 PM   Radiology No results found.  Procedures Procedures (including critical care time)  Medications Ordered in ED Medications  lactated ringers bolus 1,000 mL (0 mLs Intravenous Stopped 04/13/19 1915)  lactated ringers bolus 1,000 mL (0 mLs Intravenous Stopped 04/14/19 0127)     Initial Impression / Assessment and Plan / ED Course  I have reviewed the triage vital signs and the nursing notes.  Pertinent labs & imaging results that were available during my care of the patient were reviewed by me and considered in my medical decision making (see chart for details).        Patient is a 28 year old female with history of physical exam as above presents emergency department for evaluation of agitation and somnolence intermittently after she took reportedly 6 Benadryl tablets.  Also admits to cocaine use intranasally yesterday but denies use today.  Patient is  recently postpartum several months ago.  At the time initial evaluation patient is agitated and moving around persistently.  She is hemodynamically stable and protecting her airway breathing well on her own without difficulty.  No clonus or hyperreflexia on exam.  Pupils are equal and reactive.  Findings on exam are consistent with Benadryl overdose/coming down from cocaine.  Supportive care initiated in the emergency department for her overdose.  After several hours patient is still intermittently somnolent.  She has been in the emergency department for 8 hours at this time and is still intermittently agitated and somnolent although she is improving substantially.  Patient initially took the Benadryl for bilateral upper and lower extremity hand-and-foot redness and itching which is consistent with a possible contact dermatitis.  Doubt other emergent cause of the symptoms.  Patient denies that she injects cocaine at this time states that it has been years since she has done so.  Patient will continue to be observed in the emergency department until she is stable for discharge.  She was given additional 1 L IV fluid bolus.  Care was patient transition to oncoming team at 0100.  Final Clinical Impressions(s) / ED Diagnoses   Final diagnoses:  Diphenhydramine overdose, accidental or unintentional, initial encounter    ED Discharge Orders    None       Jonna Clarkyder, Tysin Salada, MD 04/17/19 1105    Alvira MondaySchlossman, Erin, MD 04/18/19 1237

## 2019-04-13 NOTE — ED Notes (Signed)
Ebony Hail from [employer] called to follow up.  Will follow up again in 4 hours

## 2019-04-13 NOTE — ED Notes (Signed)
Provider bedside to speak to [facility]

## 2019-04-14 MED ORDER — LACTATED RINGERS IV BOLUS
1000.0000 mL | Freq: Once | INTRAVENOUS | Status: AC
  Administered 2019-04-14: 1000 mL via INTRAVENOUS

## 2019-04-14 NOTE — ED Provider Notes (Signed)
   2:27 AM Patient awake, alert at this time.  She is aware of what happened tonight, able to express to me that she will not take any more benadryl for at least 24 hours.  Patient's mom is at bedside, will be watching her tonight.  Feel she is stable for discharge at this time.  They were given strict return precautions for any new/acute changes.   Larene Pickett, PA-C 04/14/19 0244    Ripley Fraise, MD 04/14/19 [phone]

## 2019-07-11 ENCOUNTER — Ambulatory Visit: Payer: BC Managed Care – PPO | Admitting: Internal Medicine

## 2019-10-08 ENCOUNTER — Other Ambulatory Visit: Payer: Self-pay | Admitting: Internal Medicine

## 2019-10-08 DIAGNOSIS — J4541 Moderate persistent asthma with (acute) exacerbation: Secondary | ICD-10-CM

## 2022-11-13 ENCOUNTER — Other Ambulatory Visit: Payer: Self-pay

## 2022-11-13 ENCOUNTER — Emergency Department (HOSPITAL_BASED_OUTPATIENT_CLINIC_OR_DEPARTMENT_OTHER)
Admission: EM | Admit: 2022-11-13 | Discharge: 2022-11-13 | Disposition: A | Discharge status: Home / Self Care | Payer: BC Managed Care – PPO | Attending: Emergency Medicine | Admitting: Emergency Medicine

## 2022-11-13 ENCOUNTER — Encounter (HOSPITAL_BASED_OUTPATIENT_CLINIC_OR_DEPARTMENT_OTHER): Payer: Self-pay

## 2022-11-13 DIAGNOSIS — L0291 Cutaneous abscess, unspecified: Secondary | ICD-10-CM

## 2022-11-13 DIAGNOSIS — L02411 Cutaneous abscess of right axilla: Secondary | ICD-10-CM | POA: Diagnosis present

## 2022-11-13 MED ORDER — CEPHALEXIN 500 MG PO CAPS: 500.0000 mg | ORAL_CAPSULE | Freq: Three times a day (TID) | ORAL | 0 refills | Status: AC

## 2022-11-13 MED ORDER — DOXYCYCLINE HYCLATE 100 MG PO CAPS: 100.0000 mg | ORAL_CAPSULE | Freq: Two times a day (BID) | ORAL | 0 refills | Status: DC

## 2022-11-13 MED ORDER — CEPHALEXIN 500 MG PO CAPS: 500.0000 mg | ORAL_CAPSULE | Freq: Three times a day (TID) | ORAL | 0 refills | Status: DC

## 2022-11-13 NOTE — ED Triage Notes (Signed)
Pt POV from reporting abscess in R armpit. NAD noted in triage

## 2022-11-13 NOTE — ED Provider Notes (Signed)
[place] EMERGENCY DEPARTMENT AT [facility] Provider Note   CSN: [id number] Arrival date & time: 11/13/22  2008     History Chief Complaint  Patient presents with   Abscess    HPI Kara Riddle is a 32 y.o. female presenting for chief complaint of right axillary swelling.  Concern for developing abscess.  Significant other also has developing abscess which is on antibiotics.  Patient endorsed history of similar axillary infections.   Patient's recorded medical, surgical, social, medication list and allergies were reviewed in the Snapshot window as part of the initial history.   Review of Systems   Review of Systems  Constitutional:  Negative for chills and fever.  HENT:  Negative for ear pain and sore throat.   Eyes:  Negative for pain and visual disturbance.  Respiratory:  Negative for cough and shortness of breath.   Cardiovascular:  Negative for chest pain and palpitations.  Gastrointestinal:  Negative for abdominal pain and vomiting.  Genitourinary:  Negative for dysuria and hematuria.  Musculoskeletal:  Negative for arthralgias and back pain.  Skin:  Negative for color change and rash.  Neurological:  Negative for seizures and syncope.  All other systems reviewed and are negative.   Physical Exam Updated Vital Signs BP (!) 159/80   Pulse 96   Resp 18   Ht 5\' 8"  (1.727 m)   Wt 68 kg   SpO2 96%   BMI 22.81 kg/m  Physical Exam Vitals and nursing note reviewed.  Constitutional:      General: She is not in acute distress.    Appearance: She is well-developed.  HENT:     Head: Normocephalic and atraumatic.  Eyes:     Conjunctiva/sclera: Conjunctivae normal.  Cardiovascular:     Rate and Rhythm: Normal rate and regular rhythm.     Heart sounds: No murmur heard. Pulmonary:     Effort: Pulmonary effort is normal. No respiratory distress.     Breath sounds: Normal breath sounds.  Abdominal:     General: There is no distension.     Palpations:  Abdomen is soft.     Tenderness: There is no abdominal tenderness. There is no right CVA tenderness or left CVA tenderness.  Musculoskeletal:        General: Deformity (Large palpable right axillary swelling) present. No swelling or tenderness. Normal range of motion.     Cervical back: Neck supple.  Skin:    General: Skin is warm and dry.  Neurological:     General: No focal deficit present.     Mental Status: She is alert and oriented to person, place, and time. Mental status is at baseline.     Cranial Nerves: No cranial nerve deficit.      ED Course/ Medical Decision Making/ A&P    Procedures Ultrasound ED Soft Tissue  Date/Time: 11/13/2022 9:24 PM  Performed by: Glyn Ade, MD Authorized by: Glyn Ade, MD   Procedure details:    Indications: localization of abscess     Transverse view:  Visualized   Longitudinal view:  Visualized   Images: archived     Limitations:  Patient compliance Location:    Location: axilla     Side:  Right Findings:     abscess present    cellulitis present    Medications Ordered in ED Medications - No data to display  Medical Decision Making:   Patient presenting with skin abscess.  Point-of-care ultrasound was performed demonstrating findings consistent with a skin  abscess.  There is a backwall identified.  Offered Incision and drainage to manage, however patient has a history of recurrent axillary abscesses and states that drainage is worse than spontaneous management.  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 Hemodynamically stable acute distress.  No evidence of acute systemic infection such as sepsis.  Disposition:  I have considered need for hospitalization, however, considering all of the above, I believe this patient is stable for discharge at this time.  Patient/family educated about specific return precautions for given chief complaint and symptoms.  Patient/family  educated about follow-up with PCP.     Patient/family expressed understanding of return precautions and need for follow-up. Patient spoken to regarding all imaging and laboratory results and appropriate follow up for these results. All education provided in verbal form with additional information in written form. Time was allowed for answering of patient questions. Patient discharged.    Clinical Impression: No diagnosis found.   Data Unavailable   Final Clinical Impression(s) / ED Diagnoses Final diagnoses:  None    Rx / DC Orders ED Discharge Orders     None         Glyn Ade, MD 11/13/22 2128

## 2022-11-16 ENCOUNTER — Encounter (HOSPITAL_BASED_OUTPATIENT_CLINIC_OR_DEPARTMENT_OTHER): Payer: Self-pay | Admitting: Emergency Medicine

## 2022-11-16 ENCOUNTER — Other Ambulatory Visit: Payer: Self-pay

## 2022-11-16 ENCOUNTER — Emergency Department (HOSPITAL_BASED_OUTPATIENT_CLINIC_OR_DEPARTMENT_OTHER)
Admission: EM | Admit: 2022-11-16 | Discharge: 2022-11-16 | Disposition: A | Discharge status: Home / Self Care | Payer: BC Managed Care – PPO | Attending: Emergency Medicine | Admitting: Emergency Medicine

## 2022-11-16 DIAGNOSIS — L02411 Cutaneous abscess of right axilla: Secondary | ICD-10-CM | POA: Insufficient documentation

## 2022-11-16 MED ORDER — OXYCODONE-ACETAMINOPHEN 5-325 MG PO TABS
1.0000 | ORAL_TABLET | Freq: Once | ORAL | Status: AC
  Administered 2022-11-16: 1 via ORAL
  Filled 2022-11-16: qty 1

## 2022-11-16 MED ORDER — LORAZEPAM 1 MG PO TABS
1.0000 mg | ORAL_TABLET | Freq: Once | ORAL | Status: AC
  Administered 2022-11-16: 1 mg via ORAL
  Filled 2022-11-16: qty 1

## 2022-11-16 MED ORDER — LIDOCAINE HCL (PF) 1 % IJ SOLN
10.0000 mL | Freq: Once | INTRAMUSCULAR | Status: AC
  Administered 2022-11-16: 10 mL
  Filled 2022-11-16: qty 10

## 2022-11-16 NOTE — ED Provider Notes (Signed)
[place] EMERGENCY DEPARTMENT AT [facility] Provider Note   CSN: [id number] Arrival date & time: 11/16/22  1355     History  Chief Complaint  Patient presents with   Abscess    Kara Riddle is a 32 y.o. female.  Patient with no pertinent past medical history presents today with complaints of abscess. She states that same has been ongoing for the past 1 week. Presented here on 6/10 and had an ultrasound done that confirmed that this was an abscess and drainage was subsequently offered to her which she declined.  She was then discharged with doxycycline and Keflex which she has been taking as prescribed.  States that her symptoms have not improved.  She does note a remote history of sepsis from an abscess requiring drainage in the OR with IV antibiotics and admission.  She currently denies any fevers or chills.  The area has not been draining.  The history is provided by the patient. No language interpreter was used.  Abscess      Home Medications Prior to Admission medications   Medication Sig Start Date End Date Taking? Authorizing Provider  ADVAIR HFA 230-21 MCG/ACT inhaler INHALE 2 PUFFS INTO THE LUNGS 2 TIMES DAILY, RINSE MOUTH AFTER USE Patient not taking: No sig reported 10/22/15   Jetty Duhamel D, MD  cephALEXin (KEFLEX) 500 MG capsule Take 1 capsule (500 mg total) by mouth 3 (three) times daily for 10 days. 11/13/22 11/23/22  Glyn Ade, MD  doxycycline (VIBRAMYCIN) 100 MG capsule Take 1 capsule (100 mg total) by mouth 2 (two) times daily. 11/13/22   Glyn Ade, MD  montelukast (SINGULAIR) 10 MG tablet TAKE 1 TABLET BY MOUTH AT BEDTIME Patient not taking: Reported on 04/13/2019 03/03/15   Jetty Duhamel D, MD  montelukast (SINGULAIR) 10 MG tablet TAKE 1 TABLET(10 MG) BY MOUTH AT BEDTIME 10/08/19   Waymon Budge, MD  PROAIR HFA 108 [phone] Base) MCG/ACT inhaler INHALE 2 PUFFS 4 TIMES DAILY FOR 2-4 WEEKS Patient taking differently: Inhale 1-2 puffs into  the lungs every 4 (four) hours as needed for shortness of breath.  12/30/15   Waymon Budge, MD  SYMBICORT 160-4.5 MCG/ACT inhaler INHALE 2 PUFFS INTO THE LUNGS TWICE DAILY 10/08/19   Waymon Budge, MD      Allergies    Patient has no known allergies.    Review of Systems   Review of Systems  Skin:  Positive for wound.  All other systems reviewed and are negative.   Physical Exam Updated Vital Signs BP (!) 151/93 (BP Location: Left Arm)   Pulse (!) 105   Temp 97.6 F (36.4 C)   Resp (!) 22   Wt 67.1 kg   LMP 11/02/2022   SpO2 100%   BMI 22.50 kg/m  Physical Exam Vitals and nursing note reviewed.  Constitutional:      General: She is not in acute distress.    Appearance: Normal appearance. She is normal weight. She is not ill-appearing, toxic-appearing or diaphoretic.  HENT:     Head: Normocephalic and atraumatic.  Cardiovascular:     Rate and Rhythm: Normal rate.  Pulmonary:     Effort: Pulmonary effort is normal. No respiratory distress.  Musculoskeletal:        General: Normal range of motion.     Cervical back: Normal range of motion.  Skin:    General: Skin is warm and dry.     Comments: 4 cm x 4 cm area of  fluctuance with surrounding induration located in the right axilla.  No active drainage.  Neurological:     General: No focal deficit present.     Mental Status: She is alert.  Psychiatric:        Mood and Affect: Mood normal.        Behavior: Behavior normal.     ED Results / Procedures / Treatments   Labs (all labs ordered are listed, but only abnormal results are displayed) Labs Reviewed - No data to display  EKG None  Radiology No results found.  Procedures .Marland KitchenIncision and Drainage  Date/Time: 11/16/2022 5:23 PM  Performed by: Silva Bandy, PA-C Authorized by: Silva Bandy, PA-C   Consent:    Consent obtained:  Verbal   Consent given by:  Patient   Risks, benefits, and alternatives were discussed: yes     Risks discussed:   Bleeding, incomplete drainage, pain, infection and damage to other organs   Alternatives discussed:  No treatment, delayed treatment, alternative treatment, observation and referral Universal protocol:    Procedure explained and questions answered to patient or proxy's satisfaction: yes     Patient identity confirmed:  Verbally with patient Location:    Type:  Abscess   Size:  4 cm x 4 cm   Location: right axilla. Pre-procedure details:    Skin preparation:  Povidone-iodine Sedation:    Sedation type:  Anxiolysis Anesthesia:    Anesthesia method:  Local infiltration   Local anesthetic:  Lidocaine 1% w/o epi Procedure type:    Complexity:  Simple Procedure details:    Incision types:  Cruciate   Wound management:  Probed and deloculated   Drainage:  Purulent   Drainage amount:  Copious   Packing materials:  1/2 in iodoform gauze   Amount 1/2" iodoform:  3 cm Post-procedure details:    Procedure completion:  Tolerated well, no immediate complications     Medications Ordered in ED Medications  lidocaine (PF) (XYLOCAINE) 1 % injection 10 mL (10 mLs Infiltration Given 11/16/22 1619)  LORazepam (ATIVAN) tablet 1 mg (1 mg Oral Given 11/16/22 1623)  oxyCODONE-acetaminophen (PERCOCET/ROXICET) 5-325 MG per tablet 1 tablet (1 tablet Oral Given 11/16/22 1659)    ED Course/ Medical Decision Making/ A&P                             Medical Decision Making Risk Prescription drug management.   Patient presents today with complaints of right axillary abscess x 1 week.  She is afebrile, nontoxic-appearing, and in no acute distress with reassuring vital signs.  Physical exam reveals 4 cm x 4 cm area of fluctuance with surrounding induration.  No active drainage.  Chart reviewed, patient had an ultrasound on 6/10 which confirmed that this was an abscess.  Patient with skin abscess amenable to incision and drainage per above procedure which was well-tolerated.  Given the size, packing was  placed.  Recommend wound check with her PCP in the next 2 days.  Encouraged home warm soaks and flushing.  Mild signs of cellulitis is surrounding skin.  Patient is already taking doxycycline and Keflex which offer adequate coverage. Evaluation and diagnostic testing in the emergency department does not suggest an emergent condition requiring admission or immediate intervention beyond what has been performed at this time.  Plan for discharge with close PCP follow-up.  Patient is understanding and amenable with plan, educated on red flag symptoms that would prompt immediate return.  Patient discharged in stable condition.   Final Clinical Impression(s) / ED Diagnoses Final diagnoses:  Abscess of right axilla    Rx / DC Orders ED Discharge Orders     None     An After Visit Summary was printed and given to the patient.     Vear Clock 11/16/22 1727    Terald Sleeper, MD 11/17/22 [phone]

## 2022-11-16 NOTE — ED Triage Notes (Signed)
Pt arrives pov, steady gait with c/o RT side axillary abscess. Denies drainage. Recently tx for same

## 2022-11-16 NOTE — Discharge Instructions (Signed)
You were seen in the emergency department for an abscess.  We have drained the area and cleaned it. I would like you to have the wound checked and your packing removed in 2-3 days. This can be done by any doctor's office, urgent care, or emergency department. This is to make sure the area hasn't closed too soon. Try to keep the area as clean and dry as possible. It is okay to let warm soapy water run over the area, but do NOT scrub the area while the packing is in place.   It is important you finish the entire course of antibiotics that have already been prescribed for you! You can take ibuprofen or tylenol as needed for pain.   Continue to monitor how you're doing and return to the ER for new or worsening symptoms.

## 2022-12-01 ENCOUNTER — Encounter (HOSPITAL_BASED_OUTPATIENT_CLINIC_OR_DEPARTMENT_OTHER): Payer: Self-pay

## 2022-12-01 ENCOUNTER — Emergency Department (HOSPITAL_BASED_OUTPATIENT_CLINIC_OR_DEPARTMENT_OTHER)
Admission: EM | Admit: 2022-12-01 | Discharge: 2022-12-01 | Discharge status: Against Medical Advice | Payer: BC Managed Care – PPO | Attending: Emergency Medicine | Admitting: Emergency Medicine

## 2022-12-01 ENCOUNTER — Other Ambulatory Visit: Payer: Self-pay

## 2022-12-01 DIAGNOSIS — Z5321 Procedure and treatment not carried out due to patient leaving prior to being seen by health care provider: Secondary | ICD-10-CM | POA: Diagnosis not present

## 2022-12-01 DIAGNOSIS — R569 Unspecified convulsions: Secondary | ICD-10-CM | POA: Insufficient documentation

## 2022-12-01 DIAGNOSIS — R0789 Other chest pain: Secondary | ICD-10-CM | POA: Diagnosis not present

## 2022-12-01 LAB — CBG MONITORING, ED: Glucose-Capillary: 125 mg/dL — ABNORMAL HIGH (ref 70–99)

## 2022-12-01 NOTE — ED Triage Notes (Addendum)
POV from home, A&O x 4, GCS 15, amb to triage  Boyfriend sts that he got home and she was convulsing on the bed, she said she was on phone and then doesn't remember anything until he was talking to her. When she woke up she was oriented. Endorses chest pressure.

## 2023-04-26 ENCOUNTER — Emergency Department (HOSPITAL_BASED_OUTPATIENT_CLINIC_OR_DEPARTMENT_OTHER): Payer: BC Managed Care – PPO

## 2023-04-26 ENCOUNTER — Emergency Department (HOSPITAL_BASED_OUTPATIENT_CLINIC_OR_DEPARTMENT_OTHER)
Admission: EM | Admit: 2023-04-26 | Discharge: 2023-04-26 | Disposition: A | Discharge status: Home / Self Care | Payer: BC Managed Care – PPO | Attending: Emergency Medicine | Admitting: Emergency Medicine

## 2023-04-26 ENCOUNTER — Other Ambulatory Visit: Payer: Self-pay

## 2023-04-26 DIAGNOSIS — I10 Essential (primary) hypertension: Secondary | ICD-10-CM | POA: Diagnosis not present

## 2023-04-26 DIAGNOSIS — L02414 Cutaneous abscess of left upper limb: Secondary | ICD-10-CM | POA: Insufficient documentation

## 2023-04-26 DIAGNOSIS — E871 Hypo-osmolality and hyponatremia: Secondary | ICD-10-CM | POA: Insufficient documentation

## 2023-04-26 DIAGNOSIS — F17210 Nicotine dependence, cigarettes, uncomplicated: Secondary | ICD-10-CM | POA: Diagnosis not present

## 2023-04-26 DIAGNOSIS — J45909 Unspecified asthma, uncomplicated: Secondary | ICD-10-CM | POA: Diagnosis not present

## 2023-04-26 DIAGNOSIS — Z23 Encounter for immunization: Secondary | ICD-10-CM | POA: Diagnosis not present

## 2023-04-26 DIAGNOSIS — L0291 Cutaneous abscess, unspecified: Secondary | ICD-10-CM

## 2023-04-26 DIAGNOSIS — Z7951 Long term (current) use of inhaled steroids: Secondary | ICD-10-CM | POA: Diagnosis not present

## 2023-04-26 LAB — CBC WITH DIFFERENTIAL/PLATELET
Abs Immature Granulocytes: 0.02 10*3/uL (ref 0.00–0.07)
Basophils Absolute: 0 10*3/uL (ref 0.0–0.1)
Basophils Relative: 0 %
Eosinophils Absolute: 0.7 10*3/uL — ABNORMAL HIGH (ref 0.0–0.5)
Eosinophils Relative: 8 %
HCT: 39.2 % (ref 36.0–46.0)
Hemoglobin: 13.1 g/dL (ref 12.0–15.0)
Immature Granulocytes: 0 %
Lymphocytes Relative: 15 %
Lymphs Abs: 1.3 10*3/uL (ref 0.7–4.0)
MCH: 29.3 pg (ref 26.0–34.0)
MCHC: 33.4 g/dL (ref 30.0–36.0)
MCV: 87.7 fL (ref 80.0–100.0)
Monocytes Absolute: 0.6 10*3/uL (ref 0.1–1.0)
Monocytes Relative: 7 %
Neutro Abs: 5.9 10*3/uL (ref 1.7–7.7)
Neutrophils Relative %: 70 %
Platelets: 189 10*3/uL (ref 150–400)
RBC: 4.47 MIL/uL (ref 3.87–5.11)
RDW: 12.7 % (ref 11.5–15.5)
WBC: 8.5 10*3/uL (ref 4.0–10.5)
nRBC: 0 % (ref 0.0–0.2)

## 2023-04-26 LAB — COMPREHENSIVE METABOLIC PANEL
ALT: 9 U/L (ref 0–44)
AST: 12 U/L — ABNORMAL LOW (ref 15–41)
Albumin: 4.1 g/dL (ref 3.5–5.0)
Alkaline Phosphatase: 40 U/L (ref 38–126)
Anion gap: 9 (ref 5–15)
BUN: 7 mg/dL (ref 6–20)
CO2: 24 mmol/L (ref 22–32)
Calcium: 9.8 mg/dL (ref 8.9–10.3)
Chloride: 101 mmol/L (ref 98–111)
Creatinine, Ser: 0.55 mg/dL (ref 0.44–1.00)
GFR, Estimated: >60 mL/min (ref >60)
Glucose, Bld: 94 mg/dL (ref 70–99)
Potassium: 3.7 mmol/L (ref 3.5–5.1)
Sodium: 134 mmol/L — ABNORMAL LOW (ref 135–145)
Total Bilirubin: 0.3 mg/dL (ref <1.2)
Total Protein: 7.5 g/dL (ref 6.5–8.1)

## 2023-04-26 MED ORDER — AMOXICILLIN-POT CLAVULANATE 875-125 MG PO TABS: 1.0000 | ORAL_TABLET | Freq: Two times a day (BID) | ORAL | 0 refills | Status: AC

## 2023-04-26 MED ORDER — TETANUS-DIPHTH-ACELL PERTUSSIS 5-2.5-18.5 LF-MCG/0.5 IM SUSY
0.5000 mL | PREFILLED_SYRINGE | Freq: Once | INTRAMUSCULAR | Status: AC
  Administered 2023-04-26: 0.5 mL via INTRAMUSCULAR
  Filled 2023-04-26: qty 0.5

## 2023-04-26 MED ORDER — LIDOCAINE-EPINEPHRINE (PF) 2 %-1:200000 IJ SOLN
10.0000 mL | Freq: Once | INTRAMUSCULAR | Status: AC
  Administered 2023-04-26: 10 mL
  Filled 2023-04-26: qty 20

## 2023-04-26 NOTE — ED Notes (Signed)
Report given to the next RN.Marland KitchenMarland Kitchen

## 2023-04-26 NOTE — ED Provider Notes (Signed)
[place] EMERGENCY DEPARTMENT AT [facility] Provider Note   CSN: [id number] Arrival date & time: 04/26/23  1737     History  No chief complaint on file.   Kara Riddle is a 32 y.o. female.  HPI   32 year old female presents emergency department with complaints of abscess.  Patient reports abscess on her left arm for the past week or so.  Reports worsened over the past day or 2.  Denies any fever, weakness/sensory deficits in arm.  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  Patient repeatedly denies any IV drug use this time.  Has not been draining.  Patient does state that she tried to poke area with a sewing needle at home to try to drain it without success.  Past medical history significant for asthma, HSV 1, ADD  Home Medications Prior to Admission medications   Medication Sig Start Date End Date Taking? Authorizing Provider  amoxicillin-clavulanate (AUGMENTIN) 875-125 MG tablet Take 1 tablet by mouth every 12 (twelve) hours. 04/26/23  Yes Sherian Maroon A, PA  montelukast (SINGULAIR) 10 MG tablet TAKE 1 TABLET(10 MG) BY MOUTH AT BEDTIME 10/08/19   Young, Joni Fears D, MD  PROAIR HFA 108 [phone] Base) MCG/ACT inhaler INHALE 2 PUFFS 4 TIMES DAILY FOR 2-4 WEEKS Patient taking differently: Inhale 1-2 puffs into the lungs every 4 (four) hours as needed for shortness of breath.  12/30/15   Waymon Budge, MD  SYMBICORT 160-4.5 MCG/ACT inhaler INHALE 2 PUFFS INTO THE LUNGS TWICE DAILY 10/08/19   Waymon Budge, MD      Allergies    Patient has no known allergies.    Review of Systems   Review of Systems  All other systems reviewed and are negative.   Physical Exam Updated Vital Signs BP (!) 111/52 (BP Location: Right Arm)   Pulse 60   Temp 98.5 F (36.9 C)   Resp 16   SpO2 94%  Physical Exam Vitals and nursing note reviewed.  Constitutional:      General: She is not in acute distress.    Appearance: She is well-developed.   HENT:     Head: Normocephalic and atraumatic.  Eyes:     Conjunctiva/sclera: Conjunctivae normal.  Cardiovascular:     Rate and Rhythm: Normal rate and regular rhythm.     Heart sounds: No murmur heard. Pulmonary:     Effort: Pulmonary effort is normal. No respiratory distress.     Breath sounds: Normal breath sounds.  Abdominal:     Palpations: Abdomen is soft.     Tenderness: There is no abdominal tenderness.  Musculoskeletal:        General: No swelling.     Cervical back: Neck supple.  Skin:    General: Skin is warm.     Capillary Refill: Capillary refill takes less than 2 seconds.     Comments: Large area of palpable fluctuance on patient's left forearm.  See media images below.  Area tender to touch as well as warm to palpation.  Neurological:     Mental Status: She is alert.  Psychiatric:        Mood and Affect: Mood normal.        ED Results / Procedures / Treatments   Labs (all labs ordered are listed, but only abnormal results are displayed) Labs Reviewed  COMPREHENSIVE METABOLIC PANEL - Abnormal; Notable for the following components:      Result Value   Sodium 134 (*)  AST 12 (*)    All other components within normal limits  CBC WITH DIFFERENTIAL/PLATELET - Abnormal; Notable for the following components:   Eosinophils Absolute 0.7 (*)    All other components within normal limits    EKG None  Radiology DG Forearm Left  Result Date: 04/26/2023 CLINICAL DATA:  Abscess on left arm. EXAM: LEFT FOREARM - 2 VIEW COMPARISON:  None Available. FINDINGS: Soft tissue swelling noted in the proximal forearm near the elbow. No acute bony abnormality. Specifically, no fracture, subluxation, or dislocation. No bone destruction to suggest osteomyelitis. No joint effusion within the left elbow. Within the anterior proximal forearm soft tissues, there is a small linear radiopaque foreign body of unknown etiology, possibly small needle fragment. IMPRESSION: Soft tissue  swelling in the lateral elbow region. No underlying bony abnormality. Small linear radiopaque foreign body in the anterior proximal forearm soft tissues, possibly needle fragment. Electronically Signed   By: Charlett Nose M.D.   On: 04/26/2023 19:51    Procedures .Marland KitchenIncision and Drainage  Date/Time: 04/26/2023 9:29 PM  Performed by: Peter Garter, PA Authorized by: Peter Garter, PA   Consent:    Consent obtained:  Verbal   Consent given by:  Patient   Risks, benefits, and alternatives were discussed: yes     Risks discussed:  Bleeding, damage to other organs, infection, incomplete drainage and pain   Alternatives discussed:  No treatment and delayed treatment Universal protocol:    Procedure explained and questions answered to patient or proxy's satisfaction: yes     Patient identity confirmed:  Verbally with patient and arm band Location:    Type:  Abscess   Size:  8.0cm   Location:  Upper extremity   Upper extremity location:  Arm   Arm location:  L lower arm Pre-procedure details:    Skin preparation:  Povidone-iodine Sedation:    Sedation type:  None Anesthesia:    Anesthesia method:  Local infiltration   Local anesthetic:  Lidocaine 2% WITH epi Procedure type:    Complexity:  Simple Procedure details:    Ultrasound guidance: yes     Needle aspiration: no     Incision types:  Single straight   Wound management:  Probed and deloculated and irrigated with saline   Drainage:  Bloody and purulent   Drainage amount:  Copious   Wound treatment:  Drain placed   Packing materials:  1/4 in iodoform gauze   Amount 1/4" iodoform:  3" Post-procedure details:    Procedure completion:  Tolerated well, no immediate complications     Medications Ordered in ED Medications  lidocaine-EPINEPHrine (XYLOCAINE W/EPI) 2 %-1:200000 (PF) injection 10 mL (10 mLs Infiltration Given by Other 04/26/23 2025)  Tdap (BOOSTRIX) injection 0.5 mL (0.5 mLs Intramuscular Given 04/26/23 1944)     ED Course/ Medical Decision Making/ A&P                                 Medical Decision Making Amount and/or Complexity of Data Reviewed Labs: ordered. Radiology: ordered.  Risk Prescription drug management.   This patient presents to the ED for concern of abscess, this involves an extensive number of treatment options, and is a complaint that carries with it a high risk of complications and morbidity.  The differential diagnosis includes abscess, cellulitis, erysipelas, necrotizing infection, sepsis, other   Co morbidities that complicate the patient evaluation  See HPI   Additional history  obtained:  Additional history obtained from EMR External records from outside source obtained and reviewed including hospital records   Lab Tests:  I Ordered, and personally interpreted labs.  The pertinent results include: No leukocytosis.  No evidence of anemia.  Placed within range.  Mild hyponatremia of 134 but otherwise, it is within limits.  No transaminitis.  No renal dysfunction.   Imaging Studies ordered:  I ordered imaging studies including left forearm x-ray I independently visualized and interpreted imaging which showed soft tissue swelling in lateral elbow region.  No underlying bony abnormality.  Small linear radiopaque foreign body in anterior proximal forearm soft tissues. I agree with the radiologist interpretation  Cardiac Monitoring: / EKG:  The patient was maintained on a cardiac monitor.  I personally viewed and interpreted the cardiac monitored which showed an underlying rhythm of: Rhythm   Consultations Obtained:  N/a   Problem List / ED Course / Critical interventions / Medication management  Abscess I ordered medication including Tdap, lidocaine with epinephrine   Reevaluation of the patient after these medicines showed that the patient improved I have reviewed the patients home medicines and have made adjustments as needed   Social  Determinants of Health:  Polysubstance use.  E-cigarette use.   Test / Admission - Considered:  Abscess Vitals signs significant for hypertension blood pressure 142/62. Otherwise within normal range and stable throughout visit. Laboratory/imaging studies significant for: See above 31 year old female presents emergency department with complaints of abscess for the past week or so.  Denies any IV drug use.  On exam, patient with area of palpable fluctuance left forearm palpably warm with minimal surrounding erythema.  Labs and imaging obtained by triage staff.  Labs concerning for no leukocytosis.  Patient imaging concerning for metallic linear fragment concerning for needle.  Patient again declined any IV drug use.  Patient states that she may have had the tip of the sewing needle that she tried to pop the abscess with earlier in the week break off.  Area drained in manner as above.  Needle was unable to be extracted.  Will place patient on empiric antibiotics.  Given current pregnancy, will avoid any antibiotics complicating current pregnancy.  Will recommend follow-up with Ortho in the outpatient setting for further assessment regarding foreign body retention.  Will recommend close follow-up for wound evaluation in the outpatient setting in the next 2 to 3 days.  Strict return precautions discussed at length.  Treatment plan discussed at length with patient and she acknowledged understand was agreeable to said plan.  Patient overall well-appearing, afebrile in no acute distress. Worrisome signs and symptoms were discussed with the patient, and the patient acknowledged understanding to return to the ED if noticed. Patient was stable upon discharge.          Final Clinical Impression(s) / ED Diagnoses Final diagnoses:  Abscess    Rx / DC Orders ED Discharge Orders          Ordered    amoxicillin-clavulanate (AUGMENTIN) 875-125 MG tablet  Every 12 hours        04/26/23 2123               Peter Garter, [place] 04/26/23 2131    Glyn Ade, MD 04/26/23 2209

## 2023-04-26 NOTE — ED Triage Notes (Addendum)
Abscess on left arm x1 week. Delineated in triage. Diameter 8cm across. Markedly worse over past 48 hrs. Painful in elbow and forearm. Boyfriend had similar abscess on his hip 2 weeks ago. Afebrile. -N/-V/-D.

## 2023-04-26 NOTE — Discharge Instructions (Signed)
As discussed, abscess drained while in the emergency department.  Remove packing after 48 hours or so.  Will place on antibiotics for treatment of prevention of infection.  If redness extends, you develop fever, worsening pain, please return to emergency department.  Will attach information for orthopedics to follow-up with regarding presumed needle in your arm.

## 2023-08-25 ENCOUNTER — Other Ambulatory Visit: Payer: Self-pay

## 2023-08-25 ENCOUNTER — Emergency Department (HOSPITAL_COMMUNITY)

## 2023-08-25 ENCOUNTER — Encounter (HOSPITAL_COMMUNITY): Payer: Self-pay | Admitting: *Deleted

## 2023-08-25 ENCOUNTER — Emergency Department (HOSPITAL_COMMUNITY)
Admission: EM | Admit: 2023-08-25 | Discharge: 2023-08-25 | Disposition: A | Discharge status: Home / Self Care | Attending: Student | Admitting: Student

## 2023-08-25 DIAGNOSIS — J4541 Moderate persistent asthma with (acute) exacerbation: Secondary | ICD-10-CM

## 2023-08-25 DIAGNOSIS — J45909 Unspecified asthma, uncomplicated: Secondary | ICD-10-CM | POA: Diagnosis not present

## 2023-08-25 DIAGNOSIS — S0181XA Laceration without foreign body of other part of head, initial encounter: Secondary | ICD-10-CM | POA: Insufficient documentation

## 2023-08-25 DIAGNOSIS — W1839XA Other fall on same level, initial encounter: Secondary | ICD-10-CM | POA: Insufficient documentation

## 2023-08-25 DIAGNOSIS — Z7951 Long term (current) use of inhaled steroids: Secondary | ICD-10-CM | POA: Diagnosis not present

## 2023-08-25 DIAGNOSIS — R569 Unspecified convulsions: Secondary | ICD-10-CM | POA: Diagnosis not present

## 2023-08-25 DIAGNOSIS — S0993XA Unspecified injury of face, initial encounter: Secondary | ICD-10-CM | POA: Diagnosis present

## 2023-08-25 HISTORY — DX: Unspecified convulsions: R56.9

## 2023-08-25 LAB — URINALYSIS, ROUTINE W REFLEX MICROSCOPIC
Bilirubin Urine: NEGATIVE
Glucose, UA: NEGATIVE mg/dL
Ketones, ur: NEGATIVE mg/dL
Leukocytes,Ua: NEGATIVE
Nitrite: NEGATIVE
Protein, ur: NEGATIVE mg/dL
Specific Gravity, Urine: 1.006 (ref 1.005–1.030)
pH: 6 (ref 5.0–8.0)

## 2023-08-25 LAB — BASIC METABOLIC PANEL
Anion gap: 12 (ref 5–15)
BUN: 11 mg/dL (ref 6–20)
CO2: 26 mmol/L (ref 22–32)
Calcium: 9.5 mg/dL (ref 8.9–10.3)
Chloride: 99 mmol/L (ref 98–111)
Creatinine, Ser: 0.83 mg/dL (ref 0.44–1.00)
GFR, Estimated: >60 mL/min (ref >60)
Glucose, Bld: 109 mg/dL — ABNORMAL HIGH (ref 70–99)
Potassium: 3.2 mmol/L — ABNORMAL LOW (ref 3.5–5.1)
Sodium: 137 mmol/L (ref 135–145)

## 2023-08-25 LAB — CBC WITH DIFFERENTIAL/PLATELET
Abs Immature Granulocytes: 0.02 10*3/uL (ref 0.00–0.07)
Basophils Absolute: 0 10*3/uL (ref 0.0–0.1)
Basophils Relative: 1 %
Eosinophils Absolute: 0.3 10*3/uL (ref 0.0–0.5)
Eosinophils Relative: 4 %
HCT: 44.4 % (ref 36.0–46.0)
Hemoglobin: 14.4 g/dL (ref 12.0–15.0)
Immature Granulocytes: 0 %
Lymphocytes Relative: 16 %
Lymphs Abs: 1.3 10*3/uL (ref 0.7–4.0)
MCH: 28.7 pg (ref 26.0–34.0)
MCHC: 32.4 g/dL (ref 30.0–36.0)
MCV: 88.6 fL (ref 80.0–100.0)
Monocytes Absolute: 0.5 10*3/uL (ref 0.1–1.0)
Monocytes Relative: 6 %
Neutro Abs: 5.9 10*3/uL (ref 1.7–7.7)
Neutrophils Relative %: 73 %
Platelets: 234 10*3/uL (ref 150–400)
RBC: 5.01 MIL/uL (ref 3.87–5.11)
RDW: 13.5 % (ref 11.5–15.5)
WBC: 8.1 10*3/uL (ref 4.0–10.5)
nRBC: 0 % (ref 0.0–0.2)

## 2023-08-25 LAB — RAPID URINE DRUG SCREEN, HOSP PERFORMED
Amphetamines: NOT DETECTED
Barbiturates: NOT DETECTED
Benzodiazepines: NOT DETECTED
Cocaine: POSITIVE — AB
Opiates: NOT DETECTED
Tetrahydrocannabinol: NOT DETECTED

## 2023-08-25 LAB — PREGNANCY, URINE: Preg Test, Ur: NEGATIVE

## 2023-08-25 MED ORDER — POTASSIUM CHLORIDE CRYS ER 20 MEQ PO TBCR
40.0000 meq | EXTENDED_RELEASE_TABLET | Freq: Once | ORAL | Status: AC
  Administered 2023-08-25: 40 meq via ORAL
  Filled 2023-08-25: qty 2

## 2023-08-25 MED ORDER — LEVETIRACETAM IN NACL 1000 MG/100ML IV SOLN
1000.0000 mg | Freq: Once | INTRAVENOUS | Status: AC
  Administered 2023-08-25: 1000 mg via INTRAVENOUS
  Filled 2023-08-25: qty 100

## 2023-08-25 MED ORDER — LEVETIRACETAM 500 MG PO TABS: 500.0000 mg | ORAL_TABLET | Freq: Two times a day (BID) | ORAL | 1 refills | Status: DC

## 2023-08-25 MED ORDER — BUDESONIDE-FORMOTEROL FUMARATE 160-4.5 MCG/ACT IN AERO: 2.0000 | INHALATION_SPRAY | Freq: Two times a day (BID) | RESPIRATORY_TRACT | 1 refills | Status: AC

## 2023-08-25 NOTE — Discharge Instructions (Addendum)
 To the ER after a seizure today.  This was likely precipitated by you being out of your medication and potentially your lack of sleep.  As discussed avoid drugs and alcohol, she get adequate sleep, plenty of fluids and take your medicines regularly.  I have placed a referral to [facility] neurology so you can get further workup for these new onset seizures.  Come back to ER if you have new or worsening symptoms.  Per [facility] statutes, patients with seizures are not allowed to drive until they have been seizure-free for six months.  Other recommendations include using caution when using heavy equipment or power tools. Avoid working on ladders or at heights. Take showers instead of baths.  Do not swim alone.  Ensure the water temperature is not too high on the home water heater. Do not go swimming alone. Do not lock yourself in a room alone (i.e. bathroom). When caring for infants or small children, sit down when holding, feeding, or changing them to minimize risk of injury to the child in the event you have a seizure. Maintain good sleep hygiene. Avoid alcohol.  Also recommend adequate sleep, hydration, good diet and minimize stress.     During the Seizure   - First, ensure adequate ventilation and place patients on the floor on their left side  Loosen clothing around the neck and ensure the airway is patent. If the patient is clenching the teeth, do not force the mouth open with any object as this can cause severe damage - Remove all items from the surrounding that can be hazardous. The patient may be oblivious to what's happening and may not even know what he or she is doing. If the patient is confused and wandering, either gently guide him/her away and block access to outside areas - Reassure the individual and be comforting - Call 911. In most cases, the seizure ends before EMS arrives. However, there are cases when seizures may last over 3 to 5 minutes. Or the individual may have developed  breathing difficulties or severe injuries. If a pregnant patient or a person with diabetes develops a seizure, it is prudent to call an ambulance. - Finally, if the patient does not regain full consciousness, then call EMS. Most patients will remain confused for about 45 to 90 minutes after a seizure, so you must use judgment in calling for help. - Avoid restraints but make sure the patient is in a bed with padded side rails - Place the individual in a lateral position with the neck slightly flexed; this will help the saliva drain from the mouth and prevent the tongue from falling backward - Remove all nearby furniture and other hazards from the area - Provide verbal assurance as the individual is regaining consciousness - Provide the patient with privacy if possible - Call for help and start treatment as ordered by the caregiver   After the Seizure (Postictal Stage)   After a seizure, most patients experience confusion, fatigue, muscle pain and/or a headache. Thus, one should permit the individual to sleep. For the next few days, reassurance is essential. Being calm and helping reorient the person is also of importance.   Most seizures are painless and end spontaneously. Seizures are not harmful to others but can lead to complications such as stress on the lungs, brain and the heart. Individuals with prior lung problems may develop labored breathing and respiratory distress.

## 2023-08-25 NOTE — ED Triage Notes (Signed)
 BIB family from home for recent sz, with subsequent fall and head injury. Sz unwitnessed. H/o sz. Last sz 1 month ago. Ran out of meds 2d ago. Takes Keppra. Abrasion/hematoma noted to R forehead/ periorbital. Denies incontinence or biting tongue/cheek. A&O, NAD, calm, interactive, steady gait. Small lac outer R eyebrow.

## 2023-08-25 NOTE — ED Provider Notes (Signed)
 [place] EMERGENCY DEPARTMENT AT [facility] Provider Note   CSN: [id number] Arrival date & time: 08/25/23  1456     History  Chief Complaint  Patient presents with   Fall   Seizures    Kara Riddle is a 33 y.o. female. Has PMH of ADHD, asthma, presents ER today complaining of seizure.  Patient states she has had 2 seizures before this, both in February, with initial seizure she did not seek medical treatment, the second month she went to [facility] and had labs and CT and was told to follow-up with neurology, she has not been able to get an appointment yet is awaiting to get a PCP appointment so she get a referral.  She is doing laundry today when she felt hot all over and reportedly lost consciousness.  Her significant other who is at bedside stated he witnessed the seizure, is about 5 minutes of generalized shaking and "foaming at the mouth".  She was tired when she woke up.  Noted to have small cut above the right eyebrow.  Tetanus shot up-to-date, last administered in 2024.  She does note that she had been put on Keppra at her last ED visit and ran out 2 days ago.  Fall  Seizures      Home Medications Prior to Admission medications   Medication Sig Start Date End Date Taking? Authorizing Provider  levETIRAcetam (KEPPRA) 500 MG tablet Take 1 tablet (500 mg total) by mouth 2 (two) times daily. 08/25/23  Yes Ozie Lupe A, PA-C  amoxicillin-clavulanate (AUGMENTIN) 875-125 MG tablet Take 1 tablet by mouth every 12 (twelve) hours. 04/26/23   Peter Garter, PA  budesonide-formoterol (SYMBICORT) 160-4.5 MCG/ACT inhaler Inhale 2 puffs into the lungs 2 (two) times daily. 08/25/23   Carmel Sacramento A, PA-C  montelukast (SINGULAIR) 10 MG tablet TAKE 1 TABLET(10 MG) BY MOUTH AT BEDTIME 10/08/19   Young, Joni Fears D, MD  PROAIR HFA 108 [phone] Base) MCG/ACT inhaler INHALE 2 PUFFS 4 TIMES DAILY FOR 2-4 WEEKS Patient taking differently: Inhale 1-2 puffs into the lungs every 4  (four) hours as needed for shortness of breath.  12/30/15   Waymon Budge, MD      Allergies    Patient has no known allergies.    Review of Systems   Review of Systems  Neurological:  Positive for seizures.    Physical Exam Updated Vital Signs BP 122/62   Pulse 99   Temp 97.9 F (36.6 C) (Oral)   Resp 20   Wt 68 kg   LMP 07/28/2023 (Approximate)   SpO2 91%   BMI 22.81 kg/m  Physical Exam Vitals and nursing note reviewed.  Constitutional:      General: She is not in acute distress.    Appearance: She is well-developed.  HENT:     Head: Normocephalic.     Comments: Swelling and ecchymosis to right supraorbital ridge with approximately 2 cm linear laceration with mild bleeding in this area.    Right Ear: Tympanic membrane normal.     Left Ear: Tympanic membrane normal.     Nose: Nose normal.     Mouth/Throat:     Mouth: Mucous membranes are moist.  Eyes:     Extraocular Movements: Extraocular movements intact.     Conjunctiva/sclera: Conjunctivae normal.     Pupils: Pupils are equal, round, and reactive to light.  Cardiovascular:     Rate and Rhythm: Normal rate and regular rhythm.  Heart sounds: No murmur heard. Pulmonary:     Effort: Pulmonary effort is normal. No respiratory distress.     Breath sounds: Normal breath sounds.  Abdominal:     Palpations: Abdomen is soft.     Tenderness: There is no abdominal tenderness.  Musculoskeletal:        General: No swelling.     Cervical back: Neck supple.  Skin:    General: Skin is warm and dry.     Capillary Refill: Capillary refill takes less than 2 seconds.  Neurological:     General: No focal deficit present.     Mental Status: She is alert and oriented to person, place, and time.     Sensory: No sensory deficit.     Motor: No weakness.     Coordination: Coordination normal.     Gait: Gait normal.  Psychiatric:        Mood and Affect: Mood normal.        Behavior: Behavior normal.     ED Results /  Procedures / Treatments   Labs (all labs ordered are listed, but only abnormal results are displayed) Labs Reviewed  BASIC METABOLIC PANEL - Abnormal; Notable for the following components:      Result Value   Potassium 3.2 (*)    Glucose, Bld 109 (*)    All other components within normal limits  URINALYSIS, ROUTINE W REFLEX MICROSCOPIC - Abnormal; Notable for the following components:   Hgb urine dipstick SMALL (*)    Bacteria, UA RARE (*)    All other components within normal limits  RAPID URINE DRUG SCREEN, HOSP PERFORMED - Abnormal; Notable for the following components:   Cocaine POSITIVE (*)    All other components within normal limits  CBC WITH DIFFERENTIAL/PLATELET  PREGNANCY, URINE  LEVETIRACETAM LEVEL  CBG MONITORING, ED  POC URINE PREG, ED    EKG None  Radiology CT Head Wo Contrast Result Date: 08/25/2023 CLINICAL DATA:  Head trauma, moderate to severe. Witnessed seizure. Abrasion to right forehead. EXAM: CT HEAD WITHOUT CONTRAST TECHNIQUE: Contiguous axial images were obtained from the base of the skull through the vertex without intravenous contrast. RADIATION DOSE REDUCTION: This exam was performed according to the departmental dose-optimization program which includes automated exposure control, adjustment of the mA and/or kV according to patient size and/or use of iterative reconstruction technique. COMPARISON:  CT head without contrast at [facility] rocking ham 07/26/2023. FINDINGS: Brain: No acute infarct, hemorrhage, or mass lesion is present. Deep brain nuclei are within normal limits. No significant white matter lesions are present. The ventricles are of normal size. No significant extraaxial fluid collection is present. The brainstem and cerebellum are within normal limits. Midline structures are within normal limits. Vascular: No hyperdense vessel or unexpected calcification. Skull: Right supraorbital scalp soft tissue swelling is present. No underlying fracture or foreign  body is present. Sinuses/Orbits: The paranasal sinuses and mastoid air cells are clear. Fluid levels are present in the maxillary sinuses bilaterally. Fluid is present a right frontal sinus and anterior ethmoid air cells. The paranasal sinuses and mastoid air cells are otherwise clear. IMPRESSION: 1. Right supraorbital scalp soft tissue swelling without underlying fracture or foreign body. 2. Normal CT appearance of the brain. 3. Fluid levels in the maxillary sinuses bilaterally. Fluid is present a right frontal sinus and anterior ethmoid air cells. This may represent acute sinusitis. Electronically Signed   By: Marin Roberts M.D.   On: 08/25/2023 17:34    Procedures .Laceration Repair  Date/Time: 08/25/2023 4:47 PM  Performed by: Ma Rings, PA-C Authorized by: Ma Rings, PA-C   Consent:    Consent obtained:  Verbal   Consent given by:  Patient   Risks, benefits, and alternatives were discussed: yes     Risks discussed:  Infection, pain, poor cosmetic result and poor wound healing   Alternatives discussed: suture. Universal protocol:    Patient identity confirmed:  Verbally with patient Anesthesia:    Anesthesia method:  None Laceration details:    Location:  Face   Face location:  R eyebrow   Length (cm):  2 Exploration:    Hemostasis achieved with:  Direct pressure   Wound exploration: entire depth of wound visualized     Contaminated: no   Treatment:    Area cleansed with:  Saline   Amount of cleaning:  Standard   Irrigation solution:  Sterile saline   Irrigation method:  Syringe   Debridement:  None Skin repair:    Repair method:  Tissue adhesive Approximation:    Approximation:  Close Repair type:    Repair type:  Simple Post-procedure details:    Dressing:  Open (no dressing)   Procedure completion:  Tolerated well, no immediate complications     Medications Ordered in ED Medications  levETIRAcetam (KEPPRA) IVPB 1000 mg/100 mL premix (0 mg  Intravenous Stopped 08/25/23 1737)  potassium chloride SA (KLOR-CON M) CR tablet 40 mEq (40 mEq Oral Given 08/25/23 1749)    ED Course/ Medical Decision Making/ A&P                                 Medical Decision Making This patient presents to the ED for concern of reported seizure 5 minutes in duration, this involves an extensive number of treatment options, and is a complaint that carries with it a high risk of complications and morbidity.  The differential diagnosis includes seizure, hypoglycemia, syncope, head injury, laceration, other   Co morbidities that complicate the patient evaluation  Cocaine use, recent onset of seizures   Additional history obtained:  Additional history obtained from EMR External records from outside source obtained and reviewed including ER notes, CT and labs from visit last month at [facility] for visit of seizures.   Lab Tests:  I Ordered, and personally interpreted labs.  The pertinent results include: No UTI, UDS positive for cocaine, BMP shows mild hypokalemia otherwise normal, CBC normal   Imaging Studies ordered:  I ordered imaging studies including CT head I independently visualized and interpreted imaging which showed no acute intracranial findings, right supraorbital scalp scalp swelling, fluid in bilateral maxillary sinuses I agree with the radiologist interpretation     Problem List / ED Course / Critical interventions / Medication management  Seizure duration 5-minute-patient's significant other at bedside reports tonic-clonic activity at home, patient had onset of feeling hot all over and then lost consciousness and had generalized shaking for about 5 minutes, upon arrival to the ER she is alert and oriented and back to her mental baseline, she did strike her head on the concrete when she fell has a small right supraorbital laceration.  Discussed repair with the patient of sutures versus Dermabond and she adamantly preferred  Dermabond over sutures, this was the approximated with Dermabond after cleaning.  Tetanus shot is up-to-date.  Head CT does not show any other acute findings.  She did have some fluid in her sinuses  but she has no sinus pain or pressure, no fevers or chills, does not report headaches.  I do not feel she has acute bacterial sinusitis, patient informed of finding but no further treatment needed.  She also request refill on her Symbicort which was ordered and patient courage to follow-up with PCP.  Neurology referral placed.  Patient's seizure likely precipitated by her missing her medication for the past 2 days that she has been out so I reorder the Keppra as well as she was given IV Keppra in the ED Been ambulatory in the ED, feeling better and repeatedly asking to go home.  Will be discharged now that labs are fully resulted. I have reviewed the patients home medicines and have made adjustments as needed      Amount and/or Complexity of Data Reviewed Labs: ordered. Radiology: ordered.  Risk Prescription drug management.           Final Clinical Impression(s) / ED Diagnoses Final diagnoses:  Seizure (HCC)  Facial laceration, initial encounter    Rx / DC Orders ED Discharge Orders          Ordered    Ambulatory referral to Neurology       Comments: An appointment is requested in approximately: 1 week   08/25/23 1648    levETIRAcetam (KEPPRA) 500 MG tablet  2 times daily        08/25/23 1848    budesonide-formoterol (SYMBICORT) 160-4.5 MCG/ACT inhaler  2 times daily        08/25/23 [address] 08/25/23 1912    Durwin Glaze, MD 08/26/23 [phone]

## 2023-08-28 ENCOUNTER — Ambulatory Visit (INDEPENDENT_AMBULATORY_CARE_PROVIDER_SITE_OTHER): Admitting: Neurology

## 2023-08-28 ENCOUNTER — Encounter: Payer: Self-pay | Admitting: Neurology

## 2023-08-28 VITALS — BP 163/89 | HR 83 | Ht 66.0 in | Wt 169.4 lb

## 2023-08-28 DIAGNOSIS — G40301 Generalized idiopathic epilepsy and epileptic syndromes, not intractable, with status epilepticus: Secondary | ICD-10-CM | POA: Insufficient documentation

## 2023-08-28 DIAGNOSIS — F1910 Other psychoactive substance abuse, uncomplicated: Secondary | ICD-10-CM | POA: Diagnosis not present

## 2023-08-28 MED ORDER — LAMOTRIGINE 25 MG PO TABS: ORAL_TABLET | ORAL | 0 refills | Status: DC

## 2023-08-28 NOTE — Progress Notes (Signed)
 Chief Complaint  Patient presents with   New Patient (Initial Visit)    Rm17, father present, NP internal referral for Seizure:last sz was last Saturday. Hit the ground with her face and had to get eye glued. Ran out of sz meds       ASSESSMENT AND PLAN  Kara Riddle is a 33 y.o. female   Recurrent seizure Polysubstance abuse Noncompliant with medications  Keppra might not be of good option due to her underlying mood disorder, will switch to lamotrigine, 25 mg, titrating to 100 mg twice a day, she is advised to call back office if she is tolerating lamotrigine up to 75 mg twice a day, then will refill her lamotrigine 100 mg twice a day  Complete evaluation with MRI of the brain with and without contrast  EEG  No driving until seizure-free for 6 months  Return To Clinic With NP In 6 Months   DIAGNOSTIC DATA (LABS, IMAGING, TESTING) - I reviewed patient records, labs, notes, testing and imaging myself where available.   MEDICAL HISTORY:  Kara Riddle is a 33 year old female accompanied by her father, seen in request by  Kathryne Sharper primary Care NP [facility], Alcario Drought for evaluation of seizure, initial evaluation August 28, 2023  History is obtained from the patient and review of electronic medical records. I personally reviewed pertinent available imaging films in PACS.   PMHx of  Asthma  She had recurrent seizure, often associated with cocaine abuse  She had a seizure around 2019 happened in [place] well she had substance abuse of cocaine, and heroin  Recurrent seizure around October 2024  February 2025 that was witnessed by her boyfriend, with tongue biting, She was given Keppra 500 mg twice daily, missing few days of medication had recurrent seizure on August 26, 2023, no warning signs, doing laundry downstairs, woke up on the floor with facial bruise,  UDS in March 2025 was positive for cocaine, normal CBC hemoglobin of 14.4, BMP creatinine of 0.83, TSH, negative  alcohol level  CT head no intracranial abnormality, fluid level at bilateral maxillary sinus, right supraorbital scalp soft tissue swelling without underlying fracture  PHYSICAL EXAM:   Vitals:   08/28/23 0900  BP: (!) 163/89  Pulse: 83  Weight: 169 lb 6.4 oz (76.8 kg)  Height: 5\' 6"  (1.676 m)   Not recorded     Body mass index is 27.34 kg/m.  PHYSICAL EXAMNIATION:  Gen: NAD, conversant, well nourised, well groomed                     Cardiovascular: Regular rate rhythm, no peripheral edema, warm, nontender. Eyes: Conjunctivae clear without exudates or hemorrhage Neck: Supple, no carotid bruits. Pulmonary: Clear to auscultation bilaterally   NEUROLOGICAL EXAM:  MENTAL STATUS: Speech/cognition: Awake, alert, oriented to history taking and casual conversation CRANIAL NERVES: CN II: Visual fields are full to confrontation. Pupils are round equal and briskly reactive to light.  Bruised bilateral orbital soft tissue CN III, IV, VI: extraocular movement are normal. No ptosis. CN V: Facial sensation is intact to light touch CN VII: Face is symmetric with normal eye closure  CN VIII: Hearing is normal to causal conversation. CN IX, X: Phonation is normal. CN XI: Head turning and shoulder shrug are intact  MOTOR: There is no pronator drift of out-stretched arms. Muscle bulk and tone are normal. Muscle strength is normal.  REFLEXES: Reflexes are 2+ and symmetric at the biceps, triceps, knees, and ankles. Plantar responses  are flexor.  SENSORY: Intact to light touch, pinprick and vibratory sensation are intact in fingers and toes.  COORDINATION: There is no trunk or limb dysmetria noted.  GAIT/STANCE: Posture is normal. Gait is steady with normal steps, base, arm swing, and turning. Heel and toe walking are normal. Tandem gait is normal.  Romberg is absent.  REVIEW OF SYSTEMS:  Full 14 system review of systems performed and notable only for as above All other review  of systems were negative.   ALLERGIES: No Known Allergies  HOME MEDICATIONS: Current Outpatient Medications  Medication Sig Dispense Refill   amoxicillin-clavulanate (AUGMENTIN) 875-125 MG tablet Take 1 tablet by mouth every 12 (twelve) hours. 14 tablet 0   budesonide-formoterol (SYMBICORT) 160-4.5 MCG/ACT inhaler Inhale 2 puffs into the lungs 2 (two) times daily. 10.2 g 1   levETIRAcetam (KEPPRA) 500 MG tablet Take 1 tablet (500 mg total) by mouth 2 (two) times daily. 30 tablet 1   montelukast (SINGULAIR) 10 MG tablet TAKE 1 TABLET(10 MG) BY MOUTH AT BEDTIME 30 tablet 5   PROAIR HFA 108 (90 Base) MCG/ACT inhaler INHALE 2 PUFFS 4 TIMES DAILY FOR 2-4 WEEKS (Patient taking differently: Inhale 1-2 puffs into the lungs every 4 (four) hours as needed for shortness of breath.) 18 Inhaler 1   No current facility-administered medications for this visit.    PAST MEDICAL HISTORY: Past Medical History:  Diagnosis Date   ADD (attention deficit disorder)    Asthma    HSV-1 (herpes simplex virus 1) infection    Seizures (HCC)     PAST SURGICAL HISTORY: History reviewed. No pertinent surgical history.  FAMILY HISTORY: Family History  Problem Relation Age of Onset   Asthma Father    Allergies Father    Pancreatic cancer Maternal Grandmother     SOCIAL HISTORY: Social History   Socioeconomic History   Marital status: Single    Spouse name: Not on file   Number of children: 0   Years of education: Not on file   Highest education level: Not on file  Occupational History   Occupation: student at [employer]  Tobacco Use   Smoking status: Some Days    Types: E-cigarettes   Smokeless tobacco: Never  Vaping Use   Vaping status: Every Day  Substance and Sexual Activity   Alcohol use: Yes    Alcohol/week: 4.0 standard drinks of alcohol    Types: 4 Glasses of wine per week    Comment: every other day   Drug use: No   Sexual activity: Yes  Other Topics Concern   Not on file  Social  History Narrative   Not on file   Social Drivers of Health   Financial Resource Strain: Not on file  Food Insecurity: Not on file  Transportation Needs: Not on file  Physical Activity: Not on file  Stress: Not on file  Social Connections: Unknown (11/18/2022)   Received from [facility], [facility]   Social Network    Social Network: Not on file  Intimate Partner Violence: Unknown (11/18/2022)   Received from [facility], [facility]   HITS    Physically Hurt: Not on file    Insult or Talk Down To: Not on file    Threaten Physical Harm: Not on file    Scream or Curse: Not on file      Levert Feinstein, M.D. Ph.D.  [facility] [address], [address] [place], [place] [address] Ph: [phone] Fax: [phone]  CC:  Cristi Loron,  Elray Mcgregor, PA-C [address] [place],  [place] [address]  [facility], Burman Foster, NP

## 2023-08-28 NOTE — Patient Instructions (Signed)
 weeks Keppra 500mg   Lamotrigine 25mg   1st 1/1 1/1  2nd 1/1 2/2  3rd 0/1 3/3  4th 0/0 MYCHART for progress report     Lamotrigine 100mg  twice a day.

## 2023-08-30 LAB — LEVETIRACETAM LEVEL: Levetiracetam Lvl: <2 ug/mL — ABNORMAL LOW (ref 10.0–40.0)

## 2023-09-03 ENCOUNTER — Encounter: Payer: Self-pay | Admitting: *Deleted

## 2023-09-03 ENCOUNTER — Telehealth: Payer: Self-pay | Admitting: Neurology

## 2023-09-03 ENCOUNTER — Other Ambulatory Visit: Admitting: *Deleted

## 2023-09-03 NOTE — Telephone Encounter (Signed)
 Pt reschedule due to child sick and had pick up at school

## 2023-09-04 ENCOUNTER — Telehealth: Payer: Self-pay | Admitting: Neurology

## 2023-09-04 NOTE — Telephone Encounter (Signed)
 [employer] Berkley Harvey: [id number] exp. 09/04/23-10/03/23, [employer] medicaid Berkley Harvey: [id number] exp. 09/04/23-10/19/23 sent to [facility] [phone]

## 2023-09-05 ENCOUNTER — Other Ambulatory Visit: Admitting: *Deleted

## 2023-09-11 ENCOUNTER — Ambulatory Visit (INDEPENDENT_AMBULATORY_CARE_PROVIDER_SITE_OTHER): Admitting: *Deleted

## 2023-09-11 ENCOUNTER — Telehealth: Payer: Self-pay

## 2023-09-11 ENCOUNTER — Encounter: Payer: Self-pay | Admitting: Neurology

## 2023-09-11 DIAGNOSIS — G40301 Generalized idiopathic epilepsy and epileptic syndromes, not intractable, with status epilepticus: Secondary | ICD-10-CM | POA: Diagnosis not present

## 2023-09-11 NOTE — Telephone Encounter (Signed)
 Patient in office for EEG and told Tresa Endo she was out of Keppra. Call to pharmacy and spoke with Revonda Humphrey, Keppra was last dispensed on 3/23 for 30 tablets. Refilled and ready for pick up today. I spoke with Tresa Endo in sleep lab and review medication taper chart. Patient is down to taking 1 keppra nightly and refill is ready for pick up.

## 2023-09-11 NOTE — Procedures (Signed)
   HISTORY: 33 year old female with history of substance abuse, seizure  TECHNIQUE:  This is a routine 16 channel EEG recording with one channel devoted to a limited EKG recording.  It was performed during wakefulness, drowsiness and asleep.  Hyperventilation and photic stimulation were performed as activating procedures.  There are minimum muscle and movement artifact noted.  Upon maximum arousal, posterior dominant waking rhythm consistent of mildly dysrhythmic theta range activity, activities are symmetric over the bilateral posterior derivations and attenuated with eye opening.  Photic stimulation did not alter the tracing.  Hyperventilation produced mild/moderate buildup with higher amplitude and the slower activities noted.  During EEG recording, patient developed drowsiness and entered sleep, sleep EEG demonstrated architecture, there were frontal centrally dominant vertex waves and symmetric sleep spindles noted.  During EEG recording, there was no epileptiform discharge noted.  EKG demonstrate normal sinus rhythm.  CONCLUSION: This is mild abnormal EEG due to mild background slowing, indicating mild bihemispheric malfunction.  There is no electrodiagnostic evidence of epileptiform discharge.  Levert Feinstein, M.D. Ph.D.  [facility] [address] [place], [place] [address] Phone: [phone] Fax:      [phone]

## 2023-09-17 ENCOUNTER — Telehealth: Payer: Self-pay | Admitting: Neurology

## 2023-09-17 MED ORDER — LAMOTRIGINE 100 MG PO TABS: 100.0000 mg | ORAL_TABLET | Freq: Two times a day (BID) | ORAL | 3 refills | Status: AC

## 2023-09-17 NOTE — Telephone Encounter (Signed)
 Pt said medication is working. Have run out need refill. Also want to know lamoTRIgine (LAMICTAL) 25 MG tablet 3 tablets two time a day and take  levETIRAcetam (KEPPRA) 500 MG tablet two time a day. Would  like a call to discuss dosage for lamotrigine and if stop Keppra.   Pt request refill for lamoTRIgine (LAMICTAL) 25 MG tablet send to  [facility] [phone]

## 2023-09-17 NOTE — Telephone Encounter (Signed)
 Call to patient, she feels like the lamictal is helping her and needs medication refilled. She denies any episodes. Advised I would send in 100 mg tablets twice daily script and would send my chart message about Keppra.  Verbal discussion with Dr. Gracie Lav and patient is fine to stop Keppra now at the 100 mg twice daily Lamictal dose.

## 2023-09-25 ENCOUNTER — Other Ambulatory Visit

## 2023-11-07 ENCOUNTER — Ambulatory Visit: Admitting: Family Medicine
